# Patient Record
Sex: Male | Born: 1975 | Race: White | Hispanic: No | Marital: Married | Smoking: Former smoker
Health system: Southern US, Community
[De-identification: ages and names within clinical notes are randomized; demographics above are authoritative.]

## PROBLEM LIST (undated history)

## (undated) DIAGNOSIS — Z87442 Personal history of urinary calculi: Secondary | ICD-10-CM

## (undated) DIAGNOSIS — M1990 Unspecified osteoarthritis, unspecified site: Secondary | ICD-10-CM

## (undated) DIAGNOSIS — K219 Gastro-esophageal reflux disease without esophagitis: Secondary | ICD-10-CM

## (undated) DIAGNOSIS — I10 Essential (primary) hypertension: Secondary | ICD-10-CM

## (undated) DIAGNOSIS — N400 Enlarged prostate without lower urinary tract symptoms: Secondary | ICD-10-CM

## (undated) DIAGNOSIS — F419 Anxiety disorder, unspecified: Secondary | ICD-10-CM

## (undated) HISTORY — PX: CHOLECYSTECTOMY: SHX55

---

## 2003-12-18 ENCOUNTER — Emergency Department (HOSPITAL_COMMUNITY): Admission: EM | Admit: 2003-12-18 | Discharge: 2003-12-19 | Payer: Self-pay | Admitting: Emergency Medicine

## 2010-08-11 ENCOUNTER — Emergency Department (HOSPITAL_COMMUNITY)
Admission: EM | Admit: 2010-08-11 | Discharge: 2010-08-11 | Disposition: A | Discharge status: Home / Self Care | Payer: PRIVATE HEALTH INSURANCE | Attending: Emergency Medicine | Admitting: Emergency Medicine

## 2010-08-11 ENCOUNTER — Emergency Department (HOSPITAL_COMMUNITY): Payer: PRIVATE HEALTH INSURANCE

## 2010-08-11 DIAGNOSIS — R071 Chest pain on breathing: Secondary | ICD-10-CM | POA: Insufficient documentation

## 2010-08-11 DIAGNOSIS — Y92009 Unspecified place in unspecified non-institutional (private) residence as the place of occurrence of the external cause: Secondary | ICD-10-CM | POA: Insufficient documentation

## 2010-08-11 DIAGNOSIS — Y998 Other external cause status: Secondary | ICD-10-CM | POA: Insufficient documentation

## 2010-08-11 DIAGNOSIS — F17200 Nicotine dependence, unspecified, uncomplicated: Secondary | ICD-10-CM | POA: Insufficient documentation

## 2010-08-11 DIAGNOSIS — W07XXXA Fall from chair, initial encounter: Secondary | ICD-10-CM | POA: Insufficient documentation

## 2015-04-20 ENCOUNTER — Encounter (HOSPITAL_COMMUNITY): Payer: Self-pay | Admitting: Emergency Medicine

## 2015-04-20 ENCOUNTER — Emergency Department (HOSPITAL_COMMUNITY)
Admission: EM | Admit: 2015-04-20 | Discharge: 2015-04-20 | Disposition: A | Discharge status: Home / Self Care | Payer: BLUE CROSS/BLUE SHIELD | Attending: Emergency Medicine | Admitting: Emergency Medicine

## 2015-04-20 ENCOUNTER — Emergency Department (HOSPITAL_COMMUNITY): Payer: BLUE CROSS/BLUE SHIELD

## 2015-04-20 DIAGNOSIS — Z79899 Other long term (current) drug therapy: Secondary | ICD-10-CM | POA: Diagnosis not present

## 2015-04-20 DIAGNOSIS — R200 Anesthesia of skin: Secondary | ICD-10-CM | POA: Insufficient documentation

## 2015-04-20 DIAGNOSIS — R079 Chest pain, unspecified: Secondary | ICD-10-CM | POA: Diagnosis not present

## 2015-04-20 DIAGNOSIS — I10 Essential (primary) hypertension: Secondary | ICD-10-CM | POA: Diagnosis not present

## 2015-04-20 DIAGNOSIS — Z8719 Personal history of other diseases of the digestive system: Secondary | ICD-10-CM | POA: Diagnosis not present

## 2015-04-20 DIAGNOSIS — R0602 Shortness of breath: Secondary | ICD-10-CM | POA: Insufficient documentation

## 2015-04-20 DIAGNOSIS — R202 Paresthesia of skin: Secondary | ICD-10-CM | POA: Diagnosis not present

## 2015-04-20 DIAGNOSIS — F419 Anxiety disorder, unspecified: Secondary | ICD-10-CM | POA: Diagnosis not present

## 2015-04-20 DIAGNOSIS — F17200 Nicotine dependence, unspecified, uncomplicated: Secondary | ICD-10-CM | POA: Diagnosis not present

## 2015-04-20 DIAGNOSIS — Z791 Long term (current) use of non-steroidal anti-inflammatories (NSAID): Secondary | ICD-10-CM | POA: Diagnosis not present

## 2015-04-20 DIAGNOSIS — R002 Palpitations: Secondary | ICD-10-CM | POA: Insufficient documentation

## 2015-04-20 HISTORY — DX: Essential (primary) hypertension: I10

## 2015-04-20 HISTORY — DX: Gastro-esophageal reflux disease without esophagitis: K21.9

## 2015-04-20 HISTORY — DX: Anxiety disorder, unspecified: F41.9

## 2015-04-20 LAB — BASIC METABOLIC PANEL
Anion gap: 10 (ref 5–15)
BUN: 11 mg/dL (ref 6–20)
CHLORIDE: 100 mmol/L — AB (ref 101–111)
CO2: 29 mmol/L (ref 22–32)
Calcium: 9.6 mg/dL (ref 8.9–10.3)
Creatinine, Ser: 0.83 mg/dL (ref 0.61–1.24)
GFR calc non Af Amer: >60 mL/min (ref >60)
Glucose, Bld: 164 mg/dL — ABNORMAL HIGH (ref 65–99)
POTASSIUM: 3.5 mmol/L (ref 3.5–5.1)
SODIUM: 139 mmol/L (ref 135–145)

## 2015-04-20 LAB — CBC
HEMATOCRIT: 45.7 % (ref 39.0–52.0)
HEMOGLOBIN: 16 g/dL (ref 13.0–17.0)
MCH: 31.3 pg (ref 26.0–34.0)
MCHC: 35 g/dL (ref 30.0–36.0)
MCV: 89.3 fL (ref 78.0–100.0)
Platelets: 213 10*3/uL (ref 150–400)
RBC: 5.12 MIL/uL (ref 4.22–5.81)
RDW: 13 % (ref 11.5–15.5)
WBC: 10.2 10*3/uL (ref 4.0–10.5)

## 2015-04-20 LAB — D-DIMER, QUANTITATIVE: D-Dimer, Quant: <0.27 ug/mL-FEU (ref 0.00–0.50)

## 2015-04-20 LAB — TROPONIN I: Troponin I: <0.03 ng/mL (ref <0.031)

## 2015-04-20 NOTE — ED Notes (Signed)
Patient complaining of chest pain and shortness of breath starting 15 minutes ago. States "I feel like my heart is fluttering and I have numbness going down both my arms and legs."

## 2015-04-20 NOTE — ED Provider Notes (Signed)
CSN: [id number]     Arrival date & time 04/20/15  2046 History   First MD Initiated Contact with Patient 04/20/15 2304     Chief Complaint  Patient presents with  . Chest Pain  . Shortness of Breath     (Consider location/radiation/quality/duration/timing/severity/associated sxs/prior Treatment) HPI Comments: Patient presents for evaluation of chest pain. Patient reports sudden onset of fluttering and palpitations in his chest while watching TV. He then noticed pain, shortness of breath. He reports intermittent episodes of feeling like his heart was stopping and fluttering that have now resolved. He has never had similar symptoms. He is not expressing any chest pain currently. Symptoms lasted 15 minutes or so.  Patient is a 40 y.o. male presenting with chest pain and shortness of breath.  Chest Pain Associated symptoms: shortness of breath   Shortness of Breath Associated symptoms: chest pain     Past Medical History  Diagnosis Date  . Hypertension   . Anxiety   . GERD (gastroesophageal reflux disease)    Past Surgical History  Procedure Laterality Date  . Cholecystectomy     History reviewed. No pertinent family history. Social History  Substance Use Topics  . Smoking status: Current Every Day Smoker  . Smokeless tobacco: None  . Alcohol Use: No    Review of Systems  Respiratory: Positive for shortness of breath.   Cardiovascular: Positive for chest pain.  All other systems reviewed and are negative.     Allergies  Review of patient's allergies indicates no known allergies.  Home Medications   Prior to Admission medications   Medication Sig Start Date End Date Taking? Authorizing Provider  ALPRAZolam Prudy Feeler) 0.5 MG tablet Take 0.5 mg by mouth 2 (two) times daily as needed for anxiety or sleep.  03/16/15  Yes Historical Provider, MD  cyclobenzaprine (FLEXERIL) 10 MG tablet Take 10 mg by mouth 3 (three) times daily as needed for muscle spasms.  03/17/15  Yes  Historical Provider, MD  DULoxetine (CYMBALTA) 60 MG capsule Take 60 mg by mouth at bedtime. 02/23/15  Yes Historical Provider, MD  gabapentin (NEURONTIN) 300 MG capsule Take 300 mg by mouth 3 (three) times daily as needed (FOR NEUROPATHY).  02/23/15  Yes Historical Provider, MD  loratadine (CLARITIN) 10 MG tablet Take 10 mg by mouth at bedtime. 03/04/15  Yes Historical Provider, MD  nabumetone (RELAFEN) 750 MG tablet Take 750 mg by mouth 2 (two) times daily as needed for mild pain or moderate pain.  03/03/15  Yes Historical Provider, MD  phentermine 30 MG capsule Take 30 mg by mouth every morning. 03/01/15  Yes Historical Provider, MD  tamsulosin (FLOMAX) 0.4 MG CAPS capsule Take 0.4 mg by mouth daily. 03/03/15  Yes Historical Provider, MD  traMADol (ULTRAM-ER) 300 MG 24 hr tablet Take 300 mg by mouth daily. 04/11/15  Yes Historical Provider, MD   BP 125/81 mmHg  Pulse 106  Temp(Src) 97.5 F (36.4 C) (Oral)  Resp 22  Ht  (1.854 m)  Wt 360 lb (163.295 kg)  BMI 47.51 kg/m2  SpO2 93% Physical Exam  Constitutional: He is oriented to person, place, and time. He appears well-developed and well-nourished. No distress.  HENT:  Head: Normocephalic and atraumatic.  Right Ear: Hearing normal.  Left Ear: Hearing normal.  Nose: Nose normal.  Mouth/Throat: Oropharynx is clear and moist and mucous membranes are normal.  Eyes: Conjunctivae and EOM are normal. Pupils are equal, round, and reactive to light.  Neck: Normal range of motion.  Neck supple.  Cardiovascular: Regular rhythm, S1 normal and S2 normal.  Exam reveals no gallop and no friction rub.   No murmur heard. Pulmonary/Chest: Effort normal and breath sounds normal. No respiratory distress. He exhibits no tenderness.  Abdominal: Soft. Normal appearance and bowel sounds are normal. There is no hepatosplenomegaly. There is no tenderness. There is no rebound, no guarding, no tenderness at McBurney's point and negative Murphy's sign. No  hernia.  Musculoskeletal: Normal range of motion.  Neurological: He is alert and oriented to person, place, and time. He has normal strength. No cranial nerve deficit or sensory deficit. Coordination normal. GCS eye subscore is 4. GCS verbal subscore is 5. GCS motor subscore is 6.  Skin: Skin is warm, dry and intact. No rash noted. No cyanosis.  Psychiatric: He has a normal mood and affect. His speech is normal and behavior is normal. Thought content normal.  Nursing note and vitals reviewed.   ED Course  Procedures (including critical care time) Labs Review Labs Reviewed  BASIC METABOLIC PANEL - Abnormal; Notable for the following:    Chloride 100 (*)    Glucose, Bld 164 (*)    All other components within normal limits  CBC  TROPONIN I  D-DIMER, QUANTITATIVE (NOT AT [facility])    Imaging Review Dg Chest 2 View  04/20/2015  CLINICAL DATA:  Chest pain EXAM: CHEST  2 VIEW COMPARISON:  08/11/2010 FINDINGS: Normal heart size and mediastinal contours. No acute infiltrate or edema. No effusion or pneumothorax. No acute osseous findings. IMPRESSION: No active cardiopulmonary disease. Electronically Signed   By: Marnee Spring M.D.   On: 04/20/2015 21:24   I have personally reviewed and evaluated these images and lab results as part of my medical decision-making.   EKG Interpretation   Date/Time:  Friday April 20 2015 20:53:48 EST Ventricular Rate:  119 PR Interval:  150 QRS Duration: 92 QT Interval:  304 QTC Calculation: 427 R Axis:   62 Text Interpretation:  Sinus tachycardia Otherwise normal ECG Confirmed by  Dalayza Zambrana  MD, Dareth Andrew ([id number]) on 04/20/2015 11:03:19 PM      MDM   Final diagnoses:  Heart palpitations    Patient presents primarily for symptoms of palpitations. Patient felt a sudden onset of fluttering in his chest which was intermittent and recurrent. He reports that every time he felt fluttering he felt like he could not take a deep breath and that his heart was  stopping. This was self-limited and is no longer occurring. It seems like he became very anxious with this, started having numbness and tingling in his arms and legs, possibly hyperventilation. He did not have any typical type chest pain symptoms. Although he does have cardiac risk factors, this seems to be extremely atypical for cardiac etiology, although arrhythmia as possible. He has not had any arrhythmia here in the ER. It sounds like he is describing PACs, possible atrial bigeminy, but cannot rule out other arrhythmias. Patient instructed to return immediately for recurrent symptoms to identify arrhythmia, otherwise follow up with cardiology.    Gilda Crease, MD 04/20/15 [phone]

## 2015-04-20 NOTE — Discharge Instructions (Signed)

## 2015-04-20 NOTE — ED Notes (Signed)
Pt alert & oriented x4, stable gait. Patient given discharge instructions, paperwork & prescription(s). Patient  instructed to stop at the registration desk to finish any additional paperwork. Patient verbalized understanding. Pt left department w/ no further questions. 

## 2015-10-30 ENCOUNTER — Emergency Department (HOSPITAL_COMMUNITY)
Admission: EM | Admit: 2015-10-30 | Discharge: 2015-10-30 | Disposition: A | Discharge status: Home / Self Care | Payer: BLUE CROSS/BLUE SHIELD | Attending: Emergency Medicine | Admitting: Emergency Medicine

## 2015-10-30 ENCOUNTER — Encounter (HOSPITAL_COMMUNITY): Payer: Self-pay

## 2015-10-30 ENCOUNTER — Emergency Department (HOSPITAL_COMMUNITY): Payer: BLUE CROSS/BLUE SHIELD

## 2015-10-30 DIAGNOSIS — R112 Nausea with vomiting, unspecified: Secondary | ICD-10-CM | POA: Diagnosis present

## 2015-10-30 DIAGNOSIS — F17200 Nicotine dependence, unspecified, uncomplicated: Secondary | ICD-10-CM | POA: Insufficient documentation

## 2015-10-30 DIAGNOSIS — E860 Dehydration: Secondary | ICD-10-CM | POA: Diagnosis not present

## 2015-10-30 DIAGNOSIS — Z79899 Other long term (current) drug therapy: Secondary | ICD-10-CM | POA: Diagnosis not present

## 2015-10-30 DIAGNOSIS — I10 Essential (primary) hypertension: Secondary | ICD-10-CM | POA: Insufficient documentation

## 2015-10-30 DIAGNOSIS — R197 Diarrhea, unspecified: Secondary | ICD-10-CM

## 2015-10-30 DIAGNOSIS — R1031 Right lower quadrant pain: Secondary | ICD-10-CM | POA: Diagnosis not present

## 2015-10-30 LAB — COMPREHENSIVE METABOLIC PANEL
ALK PHOS: 49 U/L (ref 38–126)
ALT: 38 U/L (ref 17–63)
ANION GAP: 10 (ref 5–15)
AST: 22 U/L (ref 15–41)
Albumin: 3.9 g/dL (ref 3.5–5.0)
BUN: 13 mg/dL (ref 6–20)
CALCIUM: 8.6 mg/dL — AB (ref 8.9–10.3)
CO2: 20 mmol/L — ABNORMAL LOW (ref 22–32)
CREATININE: 0.81 mg/dL (ref 0.61–1.24)
Chloride: 103 mmol/L (ref 101–111)
Glucose, Bld: 136 mg/dL — ABNORMAL HIGH (ref 65–99)
Potassium: 3.8 mmol/L (ref 3.5–5.1)
SODIUM: 133 mmol/L — AB (ref 135–145)
Total Bilirubin: 1.5 mg/dL — ABNORMAL HIGH (ref 0.3–1.2)
Total Protein: 7.1 g/dL (ref 6.5–8.1)

## 2015-10-30 LAB — CBC WITH DIFFERENTIAL/PLATELET
Basophils Absolute: 0 10*3/uL (ref 0.0–0.1)
Basophils Relative: 0 %
EOS ABS: 0 10*3/uL (ref 0.0–0.7)
EOS PCT: 0 %
HCT: 48.1 % (ref 39.0–52.0)
Hemoglobin: 16.7 g/dL (ref 13.0–17.0)
LYMPHS ABS: 0.3 10*3/uL — AB (ref 0.7–4.0)
LYMPHS PCT: 3 %
MCH: 30.5 pg (ref 26.0–34.0)
MCHC: 34.7 g/dL (ref 30.0–36.0)
MCV: 87.9 fL (ref 78.0–100.0)
MONOS PCT: 3 %
Monocytes Absolute: 0.3 10*3/uL (ref 0.1–1.0)
Neutro Abs: 9.9 10*3/uL — ABNORMAL HIGH (ref 1.7–7.7)
Neutrophils Relative %: 94 %
PLATELETS: 186 10*3/uL (ref 150–400)
RBC: 5.47 MIL/uL (ref 4.22–5.81)
RDW: 13.4 % (ref 11.5–15.5)
WBC: 10.6 10*3/uL — AB (ref 4.0–10.5)

## 2015-10-30 LAB — URINALYSIS, ROUTINE W REFLEX MICROSCOPIC
BILIRUBIN URINE: NEGATIVE
Glucose, UA: NEGATIVE mg/dL
Hgb urine dipstick: NEGATIVE
KETONES UR: NEGATIVE mg/dL
LEUKOCYTES UA: NEGATIVE
NITRITE: NEGATIVE
PROTEIN: NEGATIVE mg/dL
Specific Gravity, Urine: 1.01 (ref 1.005–1.030)
pH: 6 (ref 5.0–8.0)

## 2015-10-30 LAB — I-STAT CG4 LACTIC ACID, ED
LACTIC ACID, VENOUS: 1.95 mmol/L — AB (ref 0.5–1.9)
LACTIC ACID, VENOUS: 1.13 mmol/L (ref 0.5–1.9)

## 2015-10-30 LAB — LIPASE, BLOOD: LIPASE: 13 U/L (ref 11–51)

## 2015-10-30 MED ORDER — SODIUM CHLORIDE 0.9 % IV BOLUS (SEPSIS)
1000.0000 mL | Freq: Once | INTRAVENOUS | Status: AC
  Administered 2015-10-30: 1000 mL via INTRAVENOUS

## 2015-10-30 MED ORDER — IOPAMIDOL (ISOVUE-300) INJECTION 61%
100.0000 mL | Freq: Once | INTRAVENOUS | Status: AC | PRN
  Administered 2015-10-30: 100 mL via INTRAVENOUS

## 2015-10-30 MED ORDER — ONDANSETRON HCL 4 MG PO TABS: 4.0000 mg | ORAL_TABLET | Freq: Four times a day (QID) | ORAL | 0 refills | Status: DC

## 2015-10-30 MED ORDER — HYDROCODONE-ACETAMINOPHEN 5-325 MG PO TABS: 1.0000 | ORAL_TABLET | ORAL | 0 refills | Status: DC | PRN

## 2015-10-30 MED ORDER — ONDANSETRON HCL 4 MG/2ML IJ SOLN
4.0000 mg | Freq: Once | INTRAMUSCULAR | Status: AC
  Administered 2015-10-30: 4 mg via INTRAVENOUS
  Filled 2015-10-30: qty 2

## 2015-10-30 NOTE — ED Provider Notes (Signed)
Pt has improved.  He will be discharged home with instructions to return if worse.  He has been able to tolerate po fluids.   Dan LefevreJulie Malvern Kadlec, MD 10/30/15 [phone]

## 2015-10-30 NOTE — ED Provider Notes (Signed)
[facility]-EMERGENCY DEPT Provider Note   CSN: [id number] Arrival date & time: 10/30/15  1236     History   Chief Complaint Chief Complaint  Patient presents with  . Emesis  . Diarrhea    HPI Dan Castaneda is a 40 y.o. male.  Patient presents with abdominal pain, diarrhea, nausea and vomiting. States started last night as diarrhea around 8 PM. Has had progressive abdominal cramping, multiple episodes of nonbloody diarrhea as well as about 10 episodes of nonbloody emesis since this morning. Denies any other sick contacts at home. Has had chills but no fever. He had chicken salad last night that he made himself. Other people ate the same salad and did not get sick. No recent travel or antibiotic use. Denies any testicular pain, dysuria or hematuria. No back pain. Previous cholecystectomy.   The history is provided by the patient.  Emesis   Associated symptoms include abdominal pain, chills and diarrhea. Pertinent negatives include no arthralgias, no cough, no fever, no headaches and no myalgias.  Diarrhea   Associated symptoms include abdominal pain, vomiting and chills. Pertinent negatives include no headaches, no arthralgias, no myalgias and no cough.    Past Medical History:  Diagnosis Date  . Anxiety   . GERD (gastroesophageal reflux disease)   . Hypertension     There are no active problems to display for this patient.   Past Surgical History:  Procedure Laterality Date  . CHOLECYSTECTOMY         Home Medications    Prior to Admission medications   Medication Sig Start Date End Date Taking? Authorizing Provider  ALPRAZolam [name] 0.5 MG tablet Take 0.5 mg by mouth 2 (two) times daily as needed for anxiety or sleep.  03/16/15   Historical Provider, MD  cyclobenzaprine (FLEXERIL) 10 MG tablet Take 10 mg by mouth 3 (three) times daily as needed for muscle spasms.  03/17/15   Historical Provider, MD  DULoxetine (CYMBALTA) 60 MG capsule Take 60 mg by mouth at bedtime.  02/23/15   Historical Provider, MD  gabapentin (NEURONTIN) 300 MG capsule Take 300 mg by mouth 3 (three) times daily as needed (FOR NEUROPATHY).  02/23/15   Historical Provider, MD  loratadine (CLARITIN) 10 MG tablet Take 10 mg by mouth at bedtime. 03/04/15   Historical Provider, MD  nabumetone (RELAFEN) 750 MG tablet Take 750 mg by mouth 2 (two) times daily as needed for mild pain or moderate pain.  03/03/15   Historical Provider, MD  phentermine 30 MG capsule Take 30 mg by mouth every morning. 03/01/15   Historical Provider, MD  tamsulosin (FLOMAX) 0.4 MG CAPS capsule Take 0.4 mg by mouth daily. 03/03/15   Historical Provider, MD  traMADol (ULTRAM-ER) 300 MG 24 hr tablet Take 300 mg by mouth daily. 04/11/15   Historical Provider, MD    Family History No family history on file.  Social History Social History  Substance Use Topics  . Smoking status: Current Every Day Smoker  . Smokeless tobacco: Never Used  . Alcohol use No     Allergies   Review of patient's allergies indicates no known allergies.   Review of Systems Review of Systems  Constitutional: Positive for activity change, appetite change, chills and fatigue. Negative for fever.  HENT: Negative for congestion.   Eyes: Negative for visual disturbance.  Respiratory: Negative for cough, chest tightness and shortness of breath.   Gastrointestinal: Positive for abdominal pain, diarrhea, nausea and vomiting. Negative for blood in stool.  Genitourinary: Negative  for dysuria, hematuria, testicular pain and urgency.  Musculoskeletal: Negative for arthralgias and myalgias.  Skin: Negative for rash.  Neurological: Negative for dizziness, weakness, light-headedness and headaches.  A complete 10 system review of systems was obtained and all systems are negative except as noted in the HPI and PMH.    Physical Exam Updated Vital Signs BP 127/93 (BP Location: Right Arm)   Pulse (!) 145   Temp 99 F (37.2 C) (Oral)   Resp 20   Ht  6\' 1"  (1.854 m)   Wt (!) 315 lb (142.9 kg)   SpO2 98%   BMI 41.56 kg/m   Physical Exam  Constitutional: He is oriented to person, place, and time. He appears well-developed and well-nourished. No distress.  Ill appearing, clammy  HENT:  Head: Normocephalic and atraumatic.  Mouth/Throat: No oropharyngeal exudate.  Dry mucus membranes  Eyes: Conjunctivae and EOM are normal. Pupils are equal, round, and reactive to light.  Neck: Normal range of motion. Neck supple.  No meningismus.  Cardiovascular: Normal rate, normal heart sounds and intact distal pulses.   No murmur heard. Tachycardic to 120s  Pulmonary/Chest: Effort normal and breath sounds normal. No respiratory distress.  Abdominal: Soft. There is tenderness. There is no rebound and no guarding.  Obese, TTP RLQ. No guarding or rebound  Musculoskeletal: Normal range of motion. He exhibits no edema or tenderness.  Neurological: He is alert and oriented to person, place, and time. No cranial nerve deficit. He exhibits normal muscle tone. Coordination normal.  No ataxia on finger to nose bilaterally. No pronator drift. 5/5 strength throughout. CN 2-12 intact.Equal grip strength. Sensation intact.   Skin: Skin is warm.  Psychiatric: He has a normal mood and affect. His behavior is normal.  Nursing note and vitals reviewed.    ED Treatments / Results  Labs (all labs ordered are listed, but only abnormal results are displayed) Labs Reviewed  CBC WITH DIFFERENTIAL/PLATELET - Abnormal; Notable for the following:       Result Value   WBC 10.6 (*)    Neutro Abs 9.9 (*)    Lymphs Abs 0.3 (*)    All other components within normal limits  COMPREHENSIVE METABOLIC PANEL - Abnormal; Notable for the following:    Sodium 133 (*)    CO2 20 (*)    Glucose, Bld 136 (*)    Calcium 8.6 (*)    Total Bilirubin 1.5 (*)    All other components within normal limits  I-STAT CG4 LACTIC ACID, ED - Abnormal; Notable for the following:    Lactic  Acid, Venous 1.95 (*)    All other components within normal limits  LIPASE, BLOOD  URINALYSIS, ROUTINE W REFLEX MICROSCOPIC (NOT AT [facility])  I-STAT CG4 LACTIC ACID, ED  I-STAT CG4 LACTIC ACID, ED    EKG  EKG Interpretation  Date/Time:  Tuesday October 30 2015 13:43:28 EDT Ventricular Rate:  124 PR Interval:    QRS Duration: 90 QT Interval:  289 QTC Calculation: 415 R Axis:   69 Text Interpretation:  Sinus tachycardia RSR' in V1 or V2, right VCD or RVH No significant change was found Confirmed by Manus Gunning  MD, Latesha Chesney [phone]) on 10/30/2015 1:52:41 PM       Radiology Ct Abdomen Pelvis W Contrast  Result Date: 10/30/2015 CLINICAL DATA:  Right lower quadrant pain EXAM: CT ABDOMEN AND PELVIS WITH CONTRAST TECHNIQUE: Multidetector CT imaging of the abdomen and pelvis was performed using the standard protocol following bolus administration of intravenous contrast.  CONTRAST:  100mL ISOVUE-300 IOPAMIDOL (ISOVUE-300) INJECTION 61% COMPARISON:  None. FINDINGS: Lower chest:  Lung bases clear without infiltrate or effusion. Hepatobiliary: Mild fatty infiltration of liver without focal liver lesion. Cholecystectomy. Bile ducts normal in caliber Pancreas: Negative Spleen: Negative Adrenals/Urinary Tract: Symmetric excretion of contrast by the kidneys. No renal mass or obstruction or stone. Ureters nondilated. Urinary bladder normal. Stomach/Bowel: Small hiatal hernia. Normal stomach and duodenum. Negative for bowel obstruction. No bowel edema or mass. Normal appendix. Vascular/Lymphatic: Minimal atherosclerotic calcification in the aorta without aneurysm. IVC normal. No lymphadenopathy. Reproductive: Mild prostate enlargement Other: No free fluid.  No hernia. Musculoskeletal: Lumbar disc degeneration. No acute skeletal abnormality. IMPRESSION: No acute abnormality.  No cause for acute abdominal pain. Normal appendix Small hiatal hernia Electronically Signed   By: Marlan Palauharles  Clark M.D.   On: 10/30/2015 15:57     Procedures Procedures (including critical care time)  Medications Ordered in ED Medications  sodium chloride 0.9 % bolus 1,000 mL (1,000 mLs Intravenous New Bag/Given 10/30/15 1339)  ondansetron (ZOFRAN) injection 4 mg (4 mg Intravenous Given 10/30/15 1339)     Initial Impression / Assessment and Plan / ED Course  I have reviewed the triage vital signs and the nursing notes.  Pertinent labs & imaging results that were available during my care of the patient were reviewed by me and considered in my medical decision making (see chart for details).  Clinical Course  Patient presents with diarrhea, abdominal cramping and vomiting since last night. No sick contacts or recent travel. No fever but has had chills. He is tachycardic and ill-appearing with right lower quadrant tenderness on exam.  Lactate and white blood cell count minimally elevated. Patient given IV fluids. Given his localizing right lower quadrant tenderness with GI symptoms we'll obtain CT.  Heart rate remains elevated but improving to 1 teens.  CT scan shows normal appendix. Patient feeling improved but is still tachycardic. Will receive additional liter of IV fluid and repeat lactate. We'll attempt by mouth challenge.  Dr. Particia NearingHaviland to assume care at shift change. Final Clinical Impressions(s) / ED Diagnoses   Final diagnoses:  Nausea vomiting and diarrhea  Right lower quadrant abdominal pain    New Prescriptions New Prescriptions   No medications on file     Glynn OctaveStephen Erandy Mceachern, MD 10/30/15 1606

## 2015-10-30 NOTE — ED Triage Notes (Signed)
Pt reports n/v/d since last night and generalized body aches.

## 2017-03-14 ENCOUNTER — Emergency Department (HOSPITAL_COMMUNITY): Payer: PRIVATE HEALTH INSURANCE

## 2017-03-14 ENCOUNTER — Other Ambulatory Visit: Payer: Self-pay

## 2017-03-14 ENCOUNTER — Encounter (HOSPITAL_COMMUNITY): Payer: Self-pay | Admitting: Emergency Medicine

## 2017-03-14 DIAGNOSIS — Z5321 Procedure and treatment not carried out due to patient leaving prior to being seen by health care provider: Secondary | ICD-10-CM | POA: Insufficient documentation

## 2017-03-14 DIAGNOSIS — R079 Chest pain, unspecified: Secondary | ICD-10-CM | POA: Insufficient documentation

## 2017-03-14 LAB — BASIC METABOLIC PANEL
ANION GAP: 8 (ref 5–15)
BUN: 10 mg/dL (ref 6–20)
CALCIUM: 9.3 mg/dL (ref 8.9–10.3)
CO2: 27 mmol/L (ref 22–32)
CREATININE: 0.91 mg/dL (ref 0.61–1.24)
Chloride: 101 mmol/L (ref 101–111)
GFR calc Af Amer: >60 mL/min (ref >60)
GLUCOSE: 135 mg/dL — AB (ref 65–99)
Potassium: 3.7 mmol/L (ref 3.5–5.1)
Sodium: 136 mmol/L (ref 135–145)

## 2017-03-14 LAB — I-STAT TROPONIN, ED: TROPONIN I, POC: 0 ng/mL (ref 0.00–0.08)

## 2017-03-14 LAB — CBC
HCT: 44.4 % (ref 39.0–52.0)
HEMOGLOBIN: 15.1 g/dL (ref 13.0–17.0)
MCH: 30.4 pg (ref 26.0–34.0)
MCHC: 34 g/dL (ref 30.0–36.0)
MCV: 89.3 fL (ref 78.0–100.0)
PLATELETS: 221 10*3/uL (ref 150–400)
RBC: 4.97 MIL/uL (ref 4.22–5.81)
RDW: 13.4 % (ref 11.5–15.5)
WBC: 8.2 10*3/uL (ref 4.0–10.5)

## 2017-03-14 NOTE — ED Triage Notes (Addendum)
Reports eating dinner tonight when he had sudden onset mid to left sided chest pressure.  Endorses SOB.  Had the same once before but did not find a reason at [facility].  During triage reports feeling like heart was racing.  At that point rated pain at 8/10.  Then reports feeling like he converted.  Now rating pain at 4.

## 2017-03-14 NOTE — ED Notes (Signed)
Significant other at the desk requesting patient go back to a room now.  States I have seen you let all these crackheads back before us.  Explained that we are working to get him back as soon as possible.  Tech rechecking vitals at this time.

## 2017-03-15 ENCOUNTER — Emergency Department (HOSPITAL_COMMUNITY)
Admission: EM | Admit: 2017-03-15 | Discharge: 2017-03-15 | Disposition: A | Discharge status: Home / Self Care | Payer: PRIVATE HEALTH INSURANCE | Attending: Emergency Medicine | Admitting: Emergency Medicine

## 2017-03-15 NOTE — ED Notes (Signed)
Still no answer when called x 3.  Removed from system at this time.

## 2017-03-15 NOTE — ED Notes (Signed)
Pt called to go back to a room no answer

## 2018-07-22 ENCOUNTER — Emergency Department (HOSPITAL_COMMUNITY)
Admission: EM | Admit: 2018-07-22 | Discharge: 2018-07-22 | Discharge status: Against Medical Advice | Payer: Managed Care, Other (non HMO) | Attending: Emergency Medicine | Admitting: Emergency Medicine

## 2018-07-22 ENCOUNTER — Encounter (HOSPITAL_COMMUNITY): Payer: Self-pay | Admitting: Emergency Medicine

## 2018-07-22 ENCOUNTER — Other Ambulatory Visit: Payer: Self-pay

## 2018-07-22 ENCOUNTER — Emergency Department (HOSPITAL_COMMUNITY): Payer: Managed Care, Other (non HMO)

## 2018-07-22 DIAGNOSIS — B349 Viral infection, unspecified: Secondary | ICD-10-CM | POA: Insufficient documentation

## 2018-07-22 DIAGNOSIS — J029 Acute pharyngitis, unspecified: Secondary | ICD-10-CM | POA: Diagnosis present

## 2018-07-22 DIAGNOSIS — Z20828 Contact with and (suspected) exposure to other viral communicable diseases: Secondary | ICD-10-CM | POA: Insufficient documentation

## 2018-07-22 DIAGNOSIS — Z5329 Procedure and treatment not carried out because of patient's decision for other reasons: Secondary | ICD-10-CM | POA: Diagnosis not present

## 2018-07-22 DIAGNOSIS — Z87891 Personal history of nicotine dependence: Secondary | ICD-10-CM | POA: Insufficient documentation

## 2018-07-22 DIAGNOSIS — Z79899 Other long term (current) drug therapy: Secondary | ICD-10-CM | POA: Insufficient documentation

## 2018-07-22 DIAGNOSIS — I10 Essential (primary) hypertension: Secondary | ICD-10-CM | POA: Insufficient documentation

## 2018-07-22 LAB — COMPREHENSIVE METABOLIC PANEL
ALT: 67 U/L — ABNORMAL HIGH (ref 0–44)
AST: 33 U/L (ref 15–41)
Albumin: 4.2 g/dL (ref 3.5–5.0)
Alkaline Phosphatase: 53 U/L (ref 38–126)
Anion gap: 10 (ref 5–15)
BUN: 13 mg/dL (ref 6–20)
CO2: 27 mmol/L (ref 22–32)
Calcium: 9.7 mg/dL (ref 8.9–10.3)
Chloride: 101 mmol/L (ref 98–111)
Creatinine, Ser: 0.78 mg/dL (ref 0.61–1.24)
GFR calc Af Amer: >60 mL/min (ref >60)
GFR calc non Af Amer: >60 mL/min (ref >60)
Glucose, Bld: 106 mg/dL — ABNORMAL HIGH (ref 70–99)
Potassium: 4.3 mmol/L (ref 3.5–5.1)
Sodium: 138 mmol/L (ref 135–145)
Total Bilirubin: 0.9 mg/dL (ref 0.3–1.2)
Total Protein: 7.4 g/dL (ref 6.5–8.1)

## 2018-07-22 LAB — CBC WITH DIFFERENTIAL/PLATELET
Abs Immature Granulocytes: 0.05 10*3/uL (ref 0.00–0.07)
Basophils Absolute: 0 10*3/uL (ref 0.0–0.1)
Basophils Relative: 1 %
Eosinophils Absolute: 0.1 10*3/uL (ref 0.0–0.5)
Eosinophils Relative: 1 %
HCT: 45 % (ref 39.0–52.0)
Hemoglobin: 15.5 g/dL (ref 13.0–17.0)
Immature Granulocytes: 1 %
Lymphocytes Relative: 28 %
Lymphs Abs: 1.9 10*3/uL (ref 0.7–4.0)
MCH: 30.6 pg (ref 26.0–34.0)
MCHC: 34.4 g/dL (ref 30.0–36.0)
MCV: 88.9 fL (ref 80.0–100.0)
Monocytes Absolute: 0.5 10*3/uL (ref 0.1–1.0)
Monocytes Relative: 7 %
Neutro Abs: 4.3 10*3/uL (ref 1.7–7.7)
Neutrophils Relative %: 62 %
Platelets: 239 10*3/uL (ref 150–400)
RBC: 5.06 MIL/uL (ref 4.22–5.81)
RDW: 13.2 % (ref 11.5–15.5)
WBC: 6.8 10*3/uL (ref 4.0–10.5)
nRBC: 0 % (ref 0.0–0.2)

## 2018-07-22 LAB — GROUP A STREP BY PCR: Group A Strep by PCR: NOT DETECTED

## 2018-07-22 MED ORDER — LORAZEPAM 1 MG PO TABS
1.0000 mg | ORAL_TABLET | Freq: Once | ORAL | Status: AC
  Administered 2018-07-22: 1 mg via ORAL
  Filled 2018-07-22: qty 1

## 2018-07-22 MED ORDER — METOCLOPRAMIDE HCL 5 MG/ML IJ SOLN
10.0000 mg | Freq: Once | INTRAMUSCULAR | Status: AC
  Administered 2018-07-22: 10 mg via INTRAVENOUS
  Filled 2018-07-22: qty 2

## 2018-07-22 MED ORDER — KETOROLAC TROMETHAMINE 30 MG/ML IJ SOLN
15.0000 mg | Freq: Once | INTRAMUSCULAR | Status: AC
  Administered 2018-07-22: 15 mg via INTRAVENOUS
  Filled 2018-07-22: qty 1

## 2018-07-22 MED ORDER — SODIUM CHLORIDE 0.9 % IV BOLUS
500.0000 mL | Freq: Once | INTRAVENOUS | Status: AC
  Administered 2018-07-22: 500 mL via INTRAVENOUS

## 2018-07-22 NOTE — ED Provider Notes (Signed)
[facility] EMERGENCY DEPARTMENT Provider Note   CSN: [id number] Arrival date & time: 07/22/18  1233    History   Chief Complaint Chief Complaint  Patient presents with  . Fatigue  . Diarrhea  . Sore Throat    HPI Dan Castaneda is a 43 y.o. male with history of hypertension, GERD, anxiety who presents with a 4-day history of headache, generalized fatigue, diarrhea, and 1 day history of sore throat.  Patient reports his ears have been popping and he has had some pain and fullness.  He denies any significant cough, shortness of breath, chest pain, abdominal pain, nausea, vomiting, urinary symptoms.  He has been taking ibuprofen at home.  He states his headache is the worst symptom.  He reports it started fairly suddenly on Monday, however he is unsure who it was over several seconds or several minutes to hours.  He has had some associated neck pain, but states he has no difficulty moving his neck.  His husband has had some nonspecific, vague symptoms as well.     HPI  Past Medical History:  Diagnosis Date  . Anxiety   . GERD (gastroesophageal reflux disease)   . Hypertension     There are no active problems to display for this patient.   Past Surgical History:  Procedure Laterality Date  . CHOLECYSTECTOMY          Home Medications    Prior to Admission medications   Medication Sig Start Date End Date Taking? Authorizing Provider  ALPRAZolam Prudy Feeler) 0.5 MG tablet Take 0.5 mg by mouth 2 (two) times daily as needed for anxiety or sleep.  03/16/15   [provider]  cyclobenzaprine (FLEXERIL) 10 MG tablet Take 10 mg by mouth 3 (three) times daily as needed for muscle spasms.  03/17/15   [provider]  DULoxetine (CYMBALTA) 60 MG capsule Take 60 mg by mouth at bedtime. 02/23/15   [provider]  gabapentin (NEURONTIN) 300 MG capsule Take 300 mg by mouth 3 (three) times daily as needed (FOR NEUROPATHY).  02/23/15   [provider]   HYDROcodone-acetaminophen (NORCO/VICODIN) 5-325 MG tablet Take 1 tablet by mouth every 4 (four) hours as needed. 10/30/15   Jacalyn Lefevre, MD  loratadine (CLARITIN) 10 MG tablet Take 10 mg by mouth at bedtime. 03/04/15   [provider]  nabumetone (RELAFEN) 750 MG tablet Take 750 mg by mouth 2 (two) times daily as needed for mild pain or moderate pain.  03/03/15   [provider]  ondansetron (ZOFRAN) 4 MG tablet Take 1 tablet (4 mg total) by mouth every 6 (six) hours. 10/30/15   Jacalyn Lefevre, MD  tamsulosin (FLOMAX) 0.4 MG CAPS capsule Take 0.4 mg by mouth daily. 03/03/15   [provider]  traMADol (ULTRAM-ER) 300 MG 24 hr tablet Take 300 mg by mouth daily. 04/11/15   [provider]    Family History No family history on file.  Social History Social History   Tobacco Use  . Smoking status: Former Games developer  . Smokeless tobacco: Never Used  Substance Use Topics  . Alcohol use: No  . Drug use: No     Allergies   Patient has no known allergies.   Review of Systems Review of Systems  Constitutional: Positive for fatigue. Negative for chills and fever.  HENT: Positive for ear pain and sore throat. Negative for facial swelling.   Respiratory: Negative for cough and shortness of breath.   Cardiovascular: Negative for chest pain.  Gastrointestinal: Positive for diarrhea. Negative for abdominal pain, blood in stool, nausea and vomiting.  Genitourinary: Negative for dysuria.  Musculoskeletal: Positive for neck pain. Negative for back pain and neck stiffness.  Skin: Negative for rash and wound.  Neurological: Positive for headaches.  Psychiatric/Behavioral: The patient is not nervous/anxious.      Physical Exam Updated Vital Signs BP 118/77 (BP Location: Right Arm)   Pulse 76   Temp 98.6 F (37 C) (Oral)   Resp 16   Ht 6\' 1"  (1.854 m)   Wt (!) 145.2 kg   SpO2 99%   BMI 42.22 kg/m   Physical Exam Vitals signs and nursing note  reviewed.  Constitutional:      General: He is not in acute distress.    Appearance: He is well-developed. He is not diaphoretic.  HENT:     Head: Normocephalic and atraumatic.     Mouth/Throat:     Pharynx: Posterior oropharyngeal erythema present. No oropharyngeal exudate.     Tonsils: No tonsillar exudate or tonsillar abscesses.  Eyes:     General: No scleral icterus.       Right eye: No discharge.        Left eye: No discharge.     Conjunctiva/sclera: Conjunctivae normal.     Pupils: Pupils are equal, round, and reactive to light.  Neck:     Musculoskeletal: Normal range of motion and neck supple. No neck rigidity, spinous process tenderness or muscular tenderness.     Thyroid: No thyromegaly.  Cardiovascular:     Rate and Rhythm: Normal rate and regular rhythm.     Heart sounds: Normal heart sounds. No murmur. No friction rub. No gallop.   Pulmonary:     Effort: Pulmonary effort is normal. No respiratory distress.     Breath sounds: Normal breath sounds. No stridor. No wheezing or rales.  Abdominal:     General: Bowel sounds are normal. There is no distension.     Palpations: Abdomen is soft.     Tenderness: There is no abdominal tenderness. There is no guarding or rebound.  Lymphadenopathy:     Cervical: No cervical adenopathy (anterior tenderness bilaterally, no notable edema or palpable lymph node).  Skin:    General: Skin is warm and dry.     Coloration: Skin is not pale.     Findings: No rash.  Neurological:     Mental Status: He is alert.     Coordination: Coordination normal.      ED Treatments / Results  Labs (all labs ordered are listed, but only abnormal results are displayed) Labs Reviewed  COMPREHENSIVE METABOLIC PANEL - Abnormal; Notable for the following components:      Result Value   Glucose, Bld 106 (*)    ALT 67 (*)    All other components within normal limits  GROUP A STREP BY PCR  CBC WITH DIFFERENTIAL/PLATELET    EKG None  Radiology  No results found.  Procedures Procedures (including critical care time)  Medications Ordered in ED Medications  sodium chloride 0.9 % bolus 500 mL (0 mLs Intravenous Stopped 07/22/18 1420)  metoCLOPramide (REGLAN) injection 10 mg (10 mg Intravenous Given 07/22/18 1349)  ketorolac (TORADOL) 30 MG/ML injection 15 mg (15 mg Intravenous Given 07/22/18 1348)  LORazepam (ATIVAN) tablet 1 mg (1 mg Oral Given 07/22/18 1547)     Initial Impression / Assessment and Plan / ED Course  I have reviewed the triage vital signs and the nursing notes.  Pertinent  labs & imaging results that were available during my care of the patient were reviewed by me and considered in my medical decision making (see chart for details).        Patient presenting with headache, diarrhea, ear pressure, generalized fatigue.  Suspect viral syndrome.  Patient denies any documented fever, shortness of breath, cough.  He has no abdominal pain, except for cramping prior to diarrhea.  There is no focal abdominal tenderness on exam.  Labs are unremarkable except for mild elevation in ALT, 67.  Rapid strep is negative.  COVID-19 a possibility, however unlikely considering no cardinal symptoms.  Normal neuro exam without focal deficits, although considering patient's reported sudden headache that began over 3 days ago, will obtain screening CT head.  Low suspicion of meningitis especially due to patient's significant mobility of the neck, no documented fever, and patient's resolution of headache after headache cocktail in the ED. At shift change, patient care transferred to CT Head for continued evaluation, follow up of Ivery QualeHobson Bryant, PA-C and determination of disposition. Anticipate discharge with supportive treatment and home isolation if CT negative.  Patient also evaluated by my attending, Dr. Jodi MourningZavitz, who guided the patient's management and agrees with plan.  Dan Castaneda was evaluated in Emergency Department on 07/22/2018 for the symptoms  described in the history of present illness. He was evaluated in the context of the global COVID-19 pandemic, which necessitated consideration that the patient might be at risk for infection with the SARS-CoV-2 virus that causes COVID-19. Institutional protocols and algorithms that pertain to the evaluation of patients at risk for COVID-19 are in a state of rapid change based on information released by regulatory bodies including the CDC and federal and state organizations. These policies and algorithms were followed during the patient's care in the ED.  Final Clinical Impressions(s) / ED Diagnoses   Final diagnoses:  Viral syndrome  Sore throat    ED Discharge Orders    None       Emi HolesLaw, Alletta Mattos M, PA-C 07/22/18 1636    Blane OharaZavitz, Joshua, MD 07/23/18 [phone]

## 2018-07-22 NOTE — ED Notes (Signed)
Pt didn't want to wait for CT. Singed out AMA

## 2018-07-22 NOTE — ED Triage Notes (Signed)
Pt states that he has had diaherra sore throat general fatigue and a headache since Monday

## 2018-08-23 ENCOUNTER — Ambulatory Visit (INDEPENDENT_AMBULATORY_CARE_PROVIDER_SITE_OTHER): Payer: Managed Care, Other (non HMO)

## 2018-08-23 ENCOUNTER — Other Ambulatory Visit: Payer: Self-pay

## 2018-08-23 ENCOUNTER — Encounter: Payer: Self-pay | Admitting: Orthopedic Surgery

## 2018-08-23 ENCOUNTER — Ambulatory Visit: Payer: Managed Care, Other (non HMO) | Admitting: Orthopedic Surgery

## 2018-08-23 VITALS — BP 136/97 | HR 85 | Temp 97.2°F | Ht 73.0 in | Wt 340.0 lb

## 2018-08-23 DIAGNOSIS — M25561 Pain in right knee: Secondary | ICD-10-CM | POA: Diagnosis not present

## 2018-08-23 DIAGNOSIS — M25562 Pain in left knee: Secondary | ICD-10-CM

## 2018-08-23 DIAGNOSIS — M545 Low back pain, unspecified: Secondary | ICD-10-CM

## 2018-08-23 DIAGNOSIS — Z6841 Body Mass Index (BMI) 40.0 and over, adult: Secondary | ICD-10-CM | POA: Diagnosis not present

## 2018-08-23 DIAGNOSIS — G8929 Other chronic pain: Secondary | ICD-10-CM | POA: Diagnosis not present

## 2018-08-23 MED ORDER — DICLOFENAC SODIUM 50 MG PO TBEC: 50.0000 mg | DELAYED_RELEASE_TABLET | Freq: Two times a day (BID) | ORAL | 5 refills | Status: DC

## 2018-08-23 NOTE — Progress Notes (Addendum)
NEW PROBLEM OFFICE VISIT  Chief Complaint  Patient presents with  . 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  He reports that he feels weakness in both lower extremities and that his knees may topple over at any second although he denies any trauma.  This is associated with a burning pain down the lateral side of both legs.  He has taken ibuprofen but no other treatments  He does use a cane and is used that for 6 months more so in the last 3 months   Review of Systems  Eyes: Positive for photophobia.  Cardiovascular: Positive for claudication.  Musculoskeletal: Positive for back pain, joint pain and neck pain.  Neurological: Positive for headaches.  All other systems reviewed and are negative.    Past Medical History:  Diagnosis Date  . Anxiety   . GERD (gastroesophageal reflux disease)   . Hypertension     Past Surgical History:  Procedure Laterality Date  . CHOLECYSTECTOMY      Family History  Problem Relation Age of Onset  . Diabetes Mother   . Heart disease Mother   . Hypertension Mother   . Cancer Father   . Diabetes Father   . Heart disease Father   . Hypertension Father    Social History   Tobacco Use  . Smoking status: Former Research scientist (life sciences)  . Smokeless tobacco: Never Used  Substance Use Topics  . Alcohol use: No  . Drug use: No    No Known Allergies  Current Meds  Medication Sig  . hydrochlorothiazide (HYDRODIURIL) 25 MG tablet Take 25 mg by mouth daily.    BP (!) 136/97   Pulse 85   Temp (!) 97.2 F (36.2 C)   Ht 6\' 1"  (1.854 m)   Wt (!) 340 lb (154.2 kg)   BMI 44.86 kg/m   Physical Exam Vitals signs and nursing note reviewed.  Constitutional:      Appearance: Normal appearance.  Neurological:     Mental Status: He is alert  and oriented to person, place, and time.  Psychiatric:        Mood and Affect: Mood normal.     Ortho Exam  Patient is ambulatory with a cane  His gait is remarkable for short stride length decreased cadence and decreased extension bilaterally  Right knee no effusion is seen.  We find medial lateral joint line tenderness.  His flexion arc is 130 degrees.  He is stable in coronal and sagittal plane with normal quadriceps power.  I do not see any scars or skin lesions.  He appears to have normal pain response to palpation and position sense is intact  Left knee is noted to have no effusion, he does have medial and lateral joint line tenderness with a normal flexion arc.  The cruciate ligaments and collateral ligaments are stable.  No skin lesions or surgical scars are seen.  Normal pain response to palpation and position  Distally there is no peripheral edema extremities are warm to touch  MEDICAL DECISION SECTION  Xrays were done at [facility] please see the dictated report  My independent reading of xrays:  [name]   Encounter Diagnoses  Name Primary?  . Chronic pain of left knee Yes  .  Chronic pain of right knee   . Chronic low back pain, unspecified back pain laterality, unspecified whether sciatica present   . Body mass index 40.0-44.9, adult (HCC)   . Morbid obesity (HCC)    The patient meets the AMA guidelines for Morbid (severe) obesity with a BMI > 40.0 and I have recommended weight loss.  PLAN: (Rx., injectx, surgery, frx, mri/ct)  At 43 years old the patient will have to be treated nonsurgically.  He needs to have full nonoperative treatment modalities instituted prior to any surgical intervention.  He seems to have pain severity and location along the legs consistent with lumbar referred pain and neurogenic pain and may benefit from pain management  Meds ordered this encounter  Medications  . diclofenac (VOLTAREN) 50 MG EC  tablet    Sig: Take 1 tablet (50 mg total) by mouth 2 (two) times daily.    Dispense:  60 tablet    Refill:  5   Procedure note for bilateral knee injections  Procedure note left knee injection verbal consent was obtained to inject left knee joint  Timeout was completed to confirm the site of injection  The medications used were 40 mg of Depo-Medrol and 1% lidocaine 3 cc  Anesthesia was provided by ethyl chloride and the skin was prepped with alcohol.  After cleaning the skin with alcohol a 20-gauge needle was used to inject the left knee joint. There were no complications. A sterile bandage was applied.   Procedure note right knee injection verbal consent was obtained to inject right knee joint  Timeout was completed to confirm the site of injection  The medications used were 40 mg of Depo-Medrol and 1% lidocaine 3 cc  Anesthesia was provided by ethyl chloride and the skin was prepped with alcohol.  After cleaning the skin with alcohol a 20-gauge needle was used to inject the right knee joint. There were no complications. A sterile bandage was applied.   1 YR   Dan CanadaStanley Aubrionna Istre, MD  08/23/2018 3:49 PM

## 2018-08-23 NOTE — Patient Instructions (Addendum)
Weight loss  Exercise Nsaids we will try diclofenac You received 2 injections to help with pain     Arthritis Arthritis is a term that is commonly used to refer to joint pain or joint disease. There are more than 100 types of arthritis. What are the causes? The most common cause of this condition is wear and tear of a joint. Other causes include:  Gout.  Inflammation of a joint.  An infection of a joint.  Sprains and other injuries near the joint.  A drug reaction or allergic reaction. In some cases, the cause may not be known. What are the signs or symptoms? The main symptom of this condition is pain in the joint with movement. Other symptoms include:  Redness, swelling, or stiffness at a joint.  Warmth coming from the joint.  Fever.  Overall feeling of illness. How is this diagnosed? This condition may be diagnosed with a physical exam and tests, including:  Blood tests.  Urine tests.  Imaging tests, such as MRI, X-rays, or a CT scan. Sometimes, fluid is removed from a joint for testing. How is this treated? Treatment for this condition may involve:  Treatment of the cause, if it is known.  Rest.  Raising (elevating) the joint.  Applying cold or hot packs to the joint.  Medicines to improve symptoms and reduce inflammation.  Injections of a steroid such as cortisone into the joint to help reduce pain and inflammation. Depending on the cause of your arthritis, you may need to make lifestyle changes to reduce stress on your joint. These changes may include exercising more and losing weight. Follow these instructions at home: Medicines  Take over-the-counter and prescription medicines only as told by your health care provider.  Do not take aspirin to relieve pain if gout is suspected. Activity  Rest your joint if told by your health care provider. Rest is important when your disease is active and your joint feels painful, swollen, or stiff.  Avoid  activities that make the pain worse. It is important to balance activity with rest.  Exercise your joint regularly with range-of-motion exercises as told by your health care provider. Try doing low-impact exercise, such as: ? Swimming. ? Water aerobics. ? Biking. ? Walking. Joint Care   If your joint is swollen, keep it elevated if told by your health care provider.  If your joint feels stiff in the morning, try taking a warm shower.  If directed, apply heat to the joint. If you have diabetes, do not apply heat without permission from your health care provider. ? Put a towel between the joint and the hot pack or heating pad. ? Leave the heat on the area for 20-30 minutes.  If directed, apply ice to the joint: ? Put ice in a plastic bag. ? Place a towel between your skin and the bag. ? Leave the ice on for 20 minutes, 2-3 times per day.  Keep all follow-up visits as told by your health care provider. This is important. Contact a health care provider if:  The pain gets worse.  You have a fever. Get help right away if:  You develop severe joint pain, swelling, or redness.  Many joints become painful and swollen.  You develop severe back pain.  You develop severe weakness in your leg.  You cannot control your bladder or bowels. This information is not intended to replace advice given to you by your health care provider. Make sure you discuss any questions you have  with your health care provider. Document Released: 04/10/2004 Document Revised: 08/09/2015 Document Reviewed: 05/29/2014 Elsevier Interactive Patient Education  2019 [employer].

## 2018-09-06 DIAGNOSIS — F411 Generalized anxiety disorder: Secondary | ICD-10-CM | POA: Insufficient documentation

## 2019-02-01 DIAGNOSIS — R351 Nocturia: Secondary | ICD-10-CM | POA: Insufficient documentation

## 2019-03-02 ENCOUNTER — Other Ambulatory Visit: Payer: Managed Care, Other (non HMO)

## 2019-03-02 ENCOUNTER — Ambulatory Visit: Payer: Managed Care, Other (non HMO) | Attending: Internal Medicine

## 2019-03-02 ENCOUNTER — Other Ambulatory Visit: Payer: Self-pay

## 2019-03-02 DIAGNOSIS — Z20822 Contact with and (suspected) exposure to covid-19: Secondary | ICD-10-CM

## 2019-03-04 LAB — NOVEL CORONAVIRUS, NAA: SARS-CoV-2, NAA: NOT DETECTED

## 2019-04-29 DIAGNOSIS — E559 Vitamin D deficiency, unspecified: Secondary | ICD-10-CM | POA: Insufficient documentation

## 2019-04-29 DIAGNOSIS — N401 Enlarged prostate with lower urinary tract symptoms: Secondary | ICD-10-CM | POA: Insufficient documentation

## 2019-08-09 ENCOUNTER — Ambulatory Visit: Payer: Managed Care, Other (non HMO)

## 2019-08-09 ENCOUNTER — Encounter: Payer: Self-pay | Admitting: Orthopaedic Surgery

## 2019-08-09 ENCOUNTER — Other Ambulatory Visit: Payer: Self-pay

## 2019-08-09 ENCOUNTER — Ambulatory Visit: Payer: Managed Care, Other (non HMO) | Admitting: Orthopaedic Surgery

## 2019-08-09 VITALS — BP 144/96 | HR 102 | Ht 73.0 in | Wt 330.0 lb

## 2019-08-09 DIAGNOSIS — Z6841 Body Mass Index (BMI) 40.0 and over, adult: Secondary | ICD-10-CM | POA: Diagnosis not present

## 2019-08-09 DIAGNOSIS — M25561 Pain in right knee: Secondary | ICD-10-CM | POA: Diagnosis not present

## 2019-08-09 DIAGNOSIS — G8929 Other chronic pain: Secondary | ICD-10-CM

## 2019-08-09 NOTE — Progress Notes (Signed)
Subjective:    Patient ID: Dan Castaneda, male    DOB: 09-20-1975, 44 y.o.   MRN: [id number]  HPI He had been to the beach and was coming home.  He stopped in [place], [place] to go to bathroom and felt sudden severe pain in the right knee, it popped.  He had swelling.  He could hardly walk.  He did not twist it, did not fall, did nothing untoward.  He went to the ER there and was seen. I have reviewed the notes.  No x-rays were done.  He has anterior and medial pain as well as some pain posteriorly. He is using a cane.  Nothing seems to help it.  He has tried ice, heat, rest, elevation and Advil.  He has had pain in both knees on and off for several years but no pain like this.  His knee gives way now also.   Review of Systems  Constitutional: Positive for activity change.  Musculoskeletal: Positive for arthralgias, gait problem and joint swelling.  All other systems reviewed and are negative.  For Review of Systems, all other systems reviewed and are negative.  The following is a summary of the past history medically, past history surgically, known current medicines, social history and family history.  This information is gathered electronically by the computer from prior information and documentation.  I review this each visit and have found including this information at this point in the chart is beneficial and informative.   Past Medical History:  Diagnosis Date  . Anxiety   . GERD (gastroesophageal reflux disease)   . Hypertension     Past Surgical History:  Procedure Laterality Date  . CHOLECYSTECTOMY      Current Outpatient Medications on File Prior to Visit  Medication Sig Dispense Refill  . enalapril (VASOTEC) 2.5 MG tablet Take by mouth.    . ALPRAZolam (XANAX) 0.5 MG tablet Take 0.5 mg by mouth 2 (two) times daily as needed for anxiety or sleep.   3  . cyclobenzaprine (FLEXERIL) 10 MG tablet Take 10 mg by mouth 3 (three) times daily as needed for muscle spasms.   1  .  diclofenac (VOLTAREN) 50 MG EC tablet Take 1 tablet (50 mg total) by mouth 2 (two) times daily. 60 tablet 5  . DULoxetine (CYMBALTA) 60 MG capsule Take 60 mg by mouth at bedtime.  2  . hydrochlorothiazide (HYDRODIURIL) 25 MG tablet Take 25 mg by mouth daily.    Marland Kitchen HYDROcodone-acetaminophen (NORCO) 7.5-325 MG tablet Take 1 tablet by mouth 4 (four) times daily as needed.    . loratadine (CLARITIN) 10 MG tablet Take 10 mg by mouth at bedtime.  11  . ondansetron (ZOFRAN) 4 MG tablet Take 1 tablet (4 mg total) by mouth every 6 (six) hours. (Patient not taking: Reported on 08/23/2018) 12 tablet 0  . tamsulosin (FLOMAX) 0.4 MG CAPS capsule Take 0.4 mg by mouth daily.  4   No current facility-administered medications on file prior to visit.    Social History   Socioeconomic History  . Marital status: Married    Spouse name: Not on file  . Number of children: Not on file  . Years of education: Not on file  . Highest education level: Not on file  Occupational History  . Not on file  Tobacco Use  . Smoking status: Former Games developer  . Smokeless tobacco: Never Used  Substance and Sexual Activity  . Alcohol use: No  . Drug use: No  .  Sexual activity: Not on file  Other Topics Concern  . Not on file  Social History Narrative  . Not on file   Social Determinants of Health   Financial Resource Strain:   . Difficulty of Paying Living Expenses:   Food Insecurity:   . Worried About Charity fundraiser in the Last Year:   . Arboriculturist in the Last Year:   Transportation Needs:   . Film/video editor (Medical):   Marland Kitchen Lack of Transportation (Non-Medical):   Physical Activity:   . Days of Exercise per Week:   . Minutes of Exercise per Session:   Stress:   . Feeling of Stress :   Social Connections:   . Frequency of Communication with Friends and Family:   . Frequency of Social Gatherings with Friends and Family:   . Attends Religious Services:   . Active Member of Clubs or Organizations:    . Attends Archivist Meetings:   Marland Kitchen Marital Status:   Intimate Partner Violence:   . Fear of Current or Ex-Partner:   . Emotionally Abused:   Marland Kitchen Physically Abused:   . Sexually Abused:     Family History  Problem Relation Age of Onset  . Diabetes Mother   . Heart disease Mother   . Hypertension Mother   . Cancer Father   . Diabetes Father   . Heart disease Father   . Hypertension Father     BP (!) 144/96   Pulse (!) 102   Ht 6\' 1"  (1.854 m)   Wt (!) 330 lb (149.7 kg)   BMI 43.54 kg/m   Body mass index is 43.54 kg/m.     Objective:   Physical Exam Vitals and nursing note reviewed.  Constitutional:      Appearance: He is well-developed.  HENT:     Head: Normocephalic and atraumatic.  Eyes:     Conjunctiva/sclera: Conjunctivae normal.     Pupils: Pupils are equal, round, and reactive to light.  Cardiovascular:     Rate and Rhythm: Normal rate and regular rhythm.  Pulmonary:     Effort: Pulmonary effort is normal.  Abdominal:     Palpations: Abdomen is soft.  Musculoskeletal:     Cervical back: Normal range of motion and neck supple.       Legs:  Skin:    General: Skin is warm and dry.  Neurological:     Mental Status: He is alert and oriented to person, place, and time.     Cranial Nerves: No cranial nerve deficit.     Motor: No abnormal muscle tone.     Coordination: Coordination normal.     Deep Tendon Reflexes: Reflexes are normal and symmetric. Reflexes normal.  Psychiatric:        Behavior: Behavior normal.        Thought Content: Thought content normal.        Judgment: Judgment normal.   x-rays were done of the right knee, reported separately.        Assessment & Plan:   Encounter Diagnoses  Name Primary?  . Chronic pain of right knee Yes  . Body mass index 40.0-44.9, adult ([place])   . Morbid obesity ([place])    Return in two weeks. Try to get MRI of the right knee.  I am concerned about medial meniscus tear.  He is in pain and  has giving way, using cane.  PROCEDURE NOTE:  The patient requests injections of the  right knee , verbal consent was obtained.  The right knee was prepped appropriately after time out was performed.   Sterile technique was observed and injection of 1 cc of Depo-Medrol 40 mg with several cc's of plain xylocaine. Anesthesia was provided by ethyl chloride and a 20-gauge needle was used to inject the knee area. The injection was tolerated well.  A band aid dressing was applied.  The patient was advised to apply ice later today and tomorrow to the injection sight as needed.  Take Aleve one bid.  He has reflux and cannot take NSAIDs of larger dose.  Stop Aleve if it makes his stomach worse.  Call if any problem.  Precautions discussed.   Electronically Signed Darreld Mclean, MD 5/25/20219:26 AM

## 2019-08-22 ENCOUNTER — Ambulatory Visit
Admission: RE | Admit: 2019-08-22 | Discharge: 2019-08-22 | Disposition: A | Discharge status: Home / Self Care | Payer: Managed Care, Other (non HMO) | Source: Ambulatory Visit | Attending: Orthopaedic Surgery | Admitting: Orthopaedic Surgery

## 2019-08-22 ENCOUNTER — Other Ambulatory Visit: Payer: Self-pay

## 2019-08-22 DIAGNOSIS — G8929 Other chronic pain: Secondary | ICD-10-CM

## 2019-08-24 ENCOUNTER — Ambulatory Visit: Payer: Managed Care, Other (non HMO) | Admitting: Orthopedic Surgery

## 2019-08-25 ENCOUNTER — Other Ambulatory Visit: Payer: Self-pay

## 2019-08-25 ENCOUNTER — Encounter: Payer: Self-pay | Admitting: Orthopaedic Surgery

## 2019-08-25 ENCOUNTER — Ambulatory Visit: Payer: Managed Care, Other (non HMO) | Admitting: Orthopaedic Surgery

## 2019-08-25 VITALS — BP 165/109 | HR 106 | Ht 73.0 in | Wt 330.0 lb

## 2019-08-25 DIAGNOSIS — K219 Gastro-esophageal reflux disease without esophagitis: Secondary | ICD-10-CM | POA: Insufficient documentation

## 2019-08-25 DIAGNOSIS — G8929 Other chronic pain: Secondary | ICD-10-CM

## 2019-08-25 DIAGNOSIS — Z6841 Body Mass Index (BMI) 40.0 and over, adult: Secondary | ICD-10-CM | POA: Diagnosis not present

## 2019-08-25 DIAGNOSIS — M25561 Pain in right knee: Secondary | ICD-10-CM

## 2019-08-25 DIAGNOSIS — I10 Essential (primary) hypertension: Secondary | ICD-10-CM | POA: Insufficient documentation

## 2019-08-25 DIAGNOSIS — M5020 Other cervical disc displacement, unspecified cervical region: Secondary | ICD-10-CM | POA: Insufficient documentation

## 2019-08-25 DIAGNOSIS — M5136 Other intervertebral disc degeneration, lumbar region: Secondary | ICD-10-CM | POA: Insufficient documentation

## 2019-08-25 DIAGNOSIS — M51369 Other intervertebral disc degeneration, lumbar region without mention of lumbar back pain or lower extremity pain: Secondary | ICD-10-CM | POA: Insufficient documentation

## 2019-08-25 DIAGNOSIS — R7301 Impaired fasting glucose: Secondary | ICD-10-CM | POA: Insufficient documentation

## 2019-08-25 NOTE — Progress Notes (Signed)
Patient Dan Castaneda, male DOB:[birth year], 44 y.o. URK:[id number]  Chief Complaint  Patient presents with  . Knee Pain    right/ review MRI     HPI  Dan Castaneda is a 44 y.o. male who has pain of the right knee and giving way.  He had MRI which showed: IMPRESSION: 1. Fairly extensive inferior articular surface and free edge tear involving the posterior horn and mid body region of the lateral meniscus. 2. Intact ligamentous structures and no acute bony findings. 3. 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 Chondral flap type tear suspected. 4. Small joint effusion and small Baker's cyst.  I have independently reviewed the MRI.  I have explained the findings to him.  I recommend arthroscopy.  I have explained the procedure to him.  I will have him see Dr. Romeo Apple.  He is agreeable.   Body mass index is 43.54 kg/m.  The patient meets the AMA guidelines for Morbid (severe) obesity with a BMI > 40.0 and I have recommended weight loss.   ROS  Review of Systems  All other systems reviewed and are negative.  The following is a summary of the past history medically, past history surgically, known current medicines, social history and family history.  This information is gathered electronically by the computer from prior information and documentation.  I review this each visit and have found including this information at this point in the chart is beneficial and informative.    Past Medical History:  Diagnosis Date  . Anxiety   . GERD (gastroesophageal reflux disease)   . Hypertension     Past Surgical History:  Procedure Laterality Date  . CHOLECYSTECTOMY      Family History  Problem Relation Age of Onset  . Diabetes Mother   . Heart disease Mother   . Hypertension Mother   . Cancer Father   . Diabetes Father   . Heart disease Father   . Hypertension Father     Social History Social  History   Tobacco Use  . Smoking status: Former Games developer  . Smokeless tobacco: Never Used  Substance Use Topics  . Alcohol use: No  . Drug use: No    Allergies  Allergen Reactions  . Codeine Hives    Current Outpatient Medications  Medication Sig Dispense Refill  . ALPRAZolam (XANAX) 0.5 MG tablet Take 0.5 mg by mouth 2 (two) times daily as needed for anxiety or sleep.   3  . cyclobenzaprine (FLEXERIL) 10 MG tablet Take 10 mg by mouth 3 (three) times daily as needed for muscle spasms.   1  . diclofenac (VOLTAREN) 50 MG EC tablet Take 1 tablet (50 mg total) by mouth 2 (two) times daily. 60 tablet 5  . DULoxetine (CYMBALTA) 60 MG capsule Take 60 mg by mouth at bedtime.  2  . enalapril (VASOTEC) 2.5 MG tablet Take by mouth.    Marland Kitchen HYDROcodone-acetaminophen (NORCO) 7.5-325 MG tablet Take 1 tablet by mouth 4 (four) times daily as needed.    . lansoprazole (PREVACID) 30 MG capsule Take by mouth.    . loratadine (CLARITIN) 10 MG tablet Take 10 mg by mouth at bedtime.  11  . ondansetron (ZOFRAN) 4 MG tablet Take 1 tablet (4 mg total) by mouth every 6 (six) hours. 12 tablet 0  . tamsulosin (FLOMAX) 0.4 MG CAPS capsule Take 0.4 mg by mouth daily.  4  . hydrochlorothiazide (HYDRODIURIL) 25 MG tablet Take 25 mg by  mouth daily. (Patient not taking: Reported on 08/25/2019)     No current facility-administered medications for this visit.     Physical Exam  Blood pressure (!) 165/109, pulse (!) 106, height 6\' 1"  (1.854 m), weight (!) 330 lb (149.7 kg).  Constitutional: overall normal hygiene, normal nutrition, well developed, normal grooming, normal body habitus. Assistive device:cane  Musculoskeletal: gait and station Limp right, muscle tone and strength are normal, no tremors or atrophy is present.  .  Neurological: coordination overall normal.  Deep tendon reflex/nerve stretch intact.  Sensation normal.  Cranial nerves II-XII intact.   Skin:   Normal overall no scars, lesions, ulcers or  rashes. No psoriasis.  Psychiatric: Alert and oriented x 3.  Recent memory intact, remote memory unclear.  Normal mood and affect. Well groomed.  Good eye contact.  Cardiovascular: overall no swelling, no varicosities, no edema bilaterally, normal temperatures of the legs and arms, no clubbing, cyanosis and good capillary refill.  Lymphatic: palpation is normal.  Right knee pain, lateral pain, effusion, crepitus, limp right, positive lateral [place].  NV intact.  All other systems reviewed and are negative   The patient has been educated about the nature of the problem(s) and counseled on treatment options.  The patient appeared to understand what I have discussed and is in agreement with it.  Encounter Diagnoses  Name Primary?  . Chronic pain of right knee Yes  . Body mass index 40.0-44.9, adult ([place])   . Morbid obesity ([place])     PLAN Call if any problems.  Precautions discussed.  Continue current medications.   Return to clinic to see Dr. Aline Brochure for surgery   Electronically Signed Sanjuana Kava, MD 6/10/20218:34 AM

## 2019-08-25 NOTE — Patient Instructions (Signed)
To see Dr. Harrison for possible surgery 

## 2019-09-14 ENCOUNTER — Ambulatory Visit: Payer: Managed Care, Other (non HMO) | Admitting: Orthopedic Surgery

## 2019-09-14 ENCOUNTER — Encounter: Payer: Self-pay | Admitting: Orthopedic Surgery

## 2019-09-14 ENCOUNTER — Other Ambulatory Visit: Payer: Self-pay

## 2019-09-14 VITALS — BP 156/103 | HR 94 | Ht 73.0 in | Wt 337.0 lb

## 2019-09-14 DIAGNOSIS — Z6841 Body Mass Index (BMI) 40.0 and over, adult: Secondary | ICD-10-CM | POA: Diagnosis not present

## 2019-09-14 DIAGNOSIS — G8929 Other chronic pain: Secondary | ICD-10-CM | POA: Diagnosis not present

## 2019-09-14 DIAGNOSIS — M25561 Pain in right knee: Secondary | ICD-10-CM

## 2019-09-14 NOTE — Progress Notes (Signed)
PREOP consult requested by Dr Hilda Lias for possible surgery   [facility]-  Patient ID: Dan Castaneda, male   DOB: 1975-07-26, 44 y.o.   MRN: [id number]   Chief Complaint  Patient presents with  . Knee Pain    right knee/ hurting some this morning; here to discuss surgery    HPI Dan Castaneda is a 44 y.o. male.  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  Denies numbness or tingling other than from pain from his back but currently denies any radicular symptoms 2.  No evidence of fever    has a past medical history of Anxiety, GERD (gastroesophageal reflux disease), and Hypertension.    Past Surgical History:  Procedure Laterality Date  . CHOLECYSTECTOMY       Family History  Problem Relation Age of Onset  . Diabetes Mother   . Heart disease Mother   . Hypertension Mother   . Cancer Father   . Diabetes Father   . Heart disease Father   . Hypertension Father     Social History   Tobacco Use  . Smoking status: Former Games developer  . Smokeless tobacco: Never Used  Substance Use Topics  . Alcohol use: No  . Drug use: No    Allergies  Allergen Reactions  . Codeine Hives     Current Outpatient Medications:  .  ALPRAZolam (XANAX) 0.5 MG tablet, Take 0.5 mg by mouth 2 (two) times daily as needed for anxiety or sleep. , Disp: , Rfl: 3 .  cyclobenzaprine (FLEXERIL) 10 MG tablet, Take 10 mg by mouth 3 (three) times daily as needed for muscle spasms. , Disp: , Rfl: 1 .  diclofenac (VOLTAREN) 50 MG EC tablet, Take 1 tablet (50 mg total) by mouth 2 (two) times daily., Disp: 60 tablet,  Rfl: 5 .  DULoxetine (CYMBALTA) 60 MG capsule, Take 60 mg by mouth at bedtime., Disp: , Rfl: 2 .  enalapril (VASOTEC) 2.5 MG tablet, Take by mouth., Disp: , Rfl:  .  hydrochlorothiazide (HYDRODIURIL) 25 MG tablet, Take 25 mg by mouth daily. , Disp: , Rfl:  .  HYDROcodone-acetaminophen (NORCO) 7.5-325 MG tablet, Take 1 tablet by mouth 4 (four) times daily as needed., Disp: , Rfl:  .  lansoprazole (PREVACID) 30 MG capsule, Take by mouth., Disp: , Rfl:  .  loratadine (CLARITIN) 10 MG tablet, Take 10 mg by mouth at bedtime., Disp: , Rfl: 11 .  ondansetron (ZOFRAN) 4 MG tablet, Take 1 tablet (4 mg total) by mouth every 6 (six) hours., Disp: 12 tablet, Rfl: 0 .  tamsulosin (FLOMAX) 0.4 MG CAPS capsule, Take 0.4 mg by mouth daily., Disp: , Rfl: 4     Physical Exam-Detailed Physical Exam  BP (!) 156/103   Pulse 94   Ht 6\' 1"  (1.854 m)   Wt (!) 337 lb (152.9 kg)   BMI 44.46 kg/m  Body mass index is 44.46 kg/m. The patient meets the AMA guidelines for Morbid (severe) obesity with a BMI > 40.0 and I have recommended weight loss.  Gen.  appearance normal endomorphic Cardiovascular exam the pulses are 2+ with no peripheral edema  The ambulatory status is supported by cane  Extremity examined right lower  Lateral joint line tenderness with some medial joint line tenderness decreased range of motion joint effusion strength and stability are normal  I Sensation to touch is normal The patient is oriented to person place and time The patient's mood and affect show no depression or anxiety or agitation  Other extremity :   Left lower Inspection was normal  Range of motion assessment full range of motion as recorded Stability assessment stability test reveal no instability or laxity Muscle strength and muscle tone are normal with no atrophy or tremors Skin there are no scars rashes lesions or lacerations   MEDICAL DECISION MAKING (minimum/low)  Data Reviewed  I have personally  reviewed the imaging studies and the report and my interpretation is:  X-ray shows reasonable alignment arthritis lateral compartment and MRI shows torn lateral meniscus 3 compartment arthrosis possible chondral flap tear lateral compartment  Assessment and Plan  Osteoarthritis right knee 3 compartments  Torn lateral meniscus right knee  Note of disc disease lumbar spine  The procedure has been fully reviewed with the patient; The risks and benefits of surgery have been discussed and explained and understood. Alternative treatment has also been reviewed, questions were encouraged and answered. The postoperative plan is also been reviewed.   Arthroscopy right knee lateral meniscectomy  Patient does understand that he may have some arthritis pain after surgery and he could even have some giving way symptoms if the giving way is coming from his lumbar spine degenerative condition  Fuller Canada 09/14/2019, 10:30 AM   Vickki Hearing MD

## 2019-09-14 NOTE — Patient Instructions (Signed)
Knee Arthroscopy  Knee arthroscopy is a surgery to examine the inside of the knee joint and repair any damage to cartilage, surfaces, and other soft tissues around the joint. You may have this surgery if non-surgical treatment has not relieved your symptoms. Knee arthroscopy may be used to:   Repair a torn ligament or other torn tissues. Ligaments are tissues that connect bones to each other.   Remove bone fragments.   Remove a fluid-filled sac (cyst).   Treat kneecap (patella)problems.   Treat an advanced infection in the knee (septicknee).  Arthroscopic surgery is done using a thin tube that has a light and camera on the end of it (arthroscope). The arthroscope is placed through a small incision, and the camera sends images to a screen in the operating room. The images are used to help perform the surgery.  Tell a health care provider about:   Any allergies you have.   All medicines you are taking, including vitamins, herbs, eye drops, creams, and over-the-counter medicines.   Any problems you or family members have had with anesthetic medicines.   Any blood disorders you have.   Any surgeries you have had.   Any medical conditions you have.   Whether you are pregnant or may be pregnant.  What are the risks?  Generally, this is a safe procedure. However, problems may occur, including:   Infection.   Bleeding.   Allergic reactions to medicines.   Damage to blood vessels, nerves, or tissues in the knee.   A blood clot that forms in the leg and travels to the lung (pulmonary embolism).   Failure of the surgery to relieve symptoms.   Knee stiffness.  What happens before the procedure?  Staying hydrated  Follow instructions from your health care provider about hydration, which may include:   Up to 2 hours before the procedure - you may continue to drink clear liquids, such as water, clear fruit juice, black coffee, and plain tea.  Eating and drinking restrictions  Follow instructions from your  health care provider about eating and drinking, which may include:   8 hours before the procedure - stop eating heavy meals or foods such as meat, fried foods, or fatty foods.   6 hours before the procedure - stop eating light meals or foods, such as toast or cereal.   6 hours before the procedure - stop drinking milk or drinks that contain milk.   2 hours before the procedure - stop drinking clear liquids.  Medicines  Ask your health care provider about:   Changing or stopping your regular medicines. This is especially important if you are taking diabetes medicines or blood thinners.   Taking medicines such as aspirin and ibuprofen. These medicines can thin your blood. Do not take these medicines unless your health care provider tells you to take them.   Taking over-the-counter medicines, vitamins, herbs, and supplements.  Testing   Your knee may be examined. Your health care provider may move your knee or ask you to move it in specific ways to see how much motion you have.   You may have blood tests.   You may have imaging tests, such as an X-ray, MRI, or CT scan.   You may have an electrocardiogram. This test records the electrical activity in the heart.  General instructions   Do not drink alcohol unless your health care provider says that you can.   Do not use any products that contain nicotine or tobacco, such   least 24 hours after you leave the hospital or clinic. This is important. What happens during the procedure?   To lower your risk of infection: ? Your health care team will wash or sanitize their hands. ? Hair may be removed from the surgical area. ? Your skin will be washed with soap.  An IV will be inserted into one of your  veins.  You will be given one or more of the following: ? A medicine to help you relax (sedative). ? A medicine to numb the knee area (local anesthetic). ? A medicine to make you fall asleep (general anesthetic). ? A medicine that is injected into an area of your body to numb everything below the injection site (regional anesthetic). This may be injected into your groin or thigh.  A cuff may be placed around your upper leg to slow blood flow to your lower leg during the procedure.  Several small incisions will be made around your knee.  Your knee joint will be rinsed (flushed) and filled with a germ-free solution (sterile saline). This expands the area to allow your surgeon to see the joint more clearly.  An arthroscope will be passed through one of your incisions, into your knee joint.  Other surgical instruments will be passed through the other incisions. Then, your surgeon will examine and repair your knee as needed.  The sterile saline will be drained from your knee, and the cuff will be removed from your upper leg.  Your incisions will be closed with adhesive strips or stitches (sutures) and covered with a bandage (dressing). The procedure may vary among health care providers and hospitals. What happens after the procedure?  Your blood pressure, heart rate, breathing rate, and blood oxygen level will be monitored until the medicines you were given have worn off.  You will be given pain medicine as needed.  You may be given medicine to lower your risk of blood clots.  You may have to wear compression stockings. These stockings help to prevent blood clots and reduce swelling in your legs.  Your health care provider will give you instructions about how much body weight you can safely support on your leg (weight-bearing restrictions). You may be given crutches or other devices to help you move around (assistive devices).  You may be shown how to do physical therapy exercises to  help you recover.  Do not drive until your health care provider approves. Summary  Knee arthroscopy is a surgery to examine or repair the inside of the knee joint.  Before the procedure, follow instructions from your health care provider about eating and drinking.  Plan to have someone take you home from the hospital or clinic. This information is not intended to replace advice given to you by your health care provider. Make sure you discuss any questions you have with your health care provider. Document Revised: 02/13/2017 Document Reviewed: 12/04/2016 Elsevier Patient Education  2020 [employer].

## 2019-09-23 NOTE — Patient Instructions (Signed)
Dan Castaneda  09/23/2019     @PREFPERIOPPHARMACY @   Your procedure is scheduled on   09/29/2019   Report to [facility] at  0700  A.M.  Call this number if you have problems the morning of surgery:  [phone]   Remember:  Do not eat or drink after midnight.                        Take these medicines the morning of surgery with A SIP OF WATER Xanax(if needed), flexeril(if needed),cymbalta, hydrocodone(if needed), prevacid, claritin, zofran(if needed), flomax.    Do not wear jewelry, make-up or nail polish.  Do not wear lotions, powders, or perfumes. Please wear deodorant and brush your teeth.  Do not shave 48 hours prior to surgery.  Men may shave face and neck.  Do not bring valuables to the hospital.  [facility] is not responsible for any belongings or valuables.  Contacts, dentures or bridgework may not be worn into surgery.  Leave your suitcase in the car.  After surgery it may be brought to your room.  For patients admitted to the hospital, discharge time will be determined by your treatment team.  Patients discharged the day of surgery will not be allowed to drive home.   Name and phone number of your driver:   family Special instructions:  DO NOT smoke the morning of your procedure. Please read over the following fact sheets that you were given. Anesthesia Post-op Instructions and Care and Recovery After Surgery       Arthroscopic Knee Ligament Repair, Care After This sheet gives you information about how to care for yourself after your procedure. Your health care provider may also give you more specific instructions. If you have problems or questions, contact your health care provider. What can I expect after the procedure? After the procedure, it is common to have:  Pain in your knee.  Bruising and swelling on your knee, calf, and ankle for 3-4 days.  Fatigue. Follow these instructions at home: If you have a brace or immobilizer:  Wear  the brace or immobilizer as told by your health care provider. Remove it only as told by your health care provider.  Loosen the splint or immobilizer if your toes tingle, become numb, or turn cold and blue.  Keep the brace or immobilizer clean. Bathing  Do not take baths, swim, or use a hot tub until your health care provider approves. Ask your health care provider if you can take showers.  Keep your bandage (dressing) dry until your health care provider says that it can be removed. Cover it and your brace or immobilizer with a watertight covering when you take a shower. Incision care   Follow instructions from your health care provider about how to take care of your incision. Make sure you: ? Wash your hands with soap and water before you change your bandage (dressing). If soap and water are not available, use hand sanitizer. ? Change your dressing as told by your health care provider. ? Leave stitches (sutures), skin glue, or adhesive strips in place. These skin closures may need to stay in place for 2 weeks or longer. If adhesive strip edges start to loosen and curl up, you may trim the loose edges. Do not remove adhesive strips completely unless your health care provider tells you to do that.  Check your incision area every day for  signs of infection. Check for: ? More redness, swelling, or pain. ? More fluid or blood. ? Warmth. ? Pus or a bad smell. Managing pain, stiffness, and swelling   If directed, put ice on the affected area. ? If you have a removable brace or immobilizer, remove it as told by your health care provider. ? Put ice in a plastic bag. ? Place a towel between your skin and the bag or between your brace or immobilizer and the bag. ? Leave the ice on for 20 minutes, 2-3 times a day.  Move your toes often to avoid stiffness and to lessen swelling.  Raise (elevate) the injured area above the level of your heart while you are sitting or lying down. Driving  Do  not drive until your health care provider approves. If you have a brace or immobilizer on your leg, ask your health care provider when it is safe for you to drive.  Do not drive or use heavy machinery while taking prescription pain medicine. Activity  Rest as directed. Ask your health care provider what activities are safe for you.  Do physical therapy exercises as told by your health care provider. Physical therapy will help you regain strength and motion in your knee.  Follow instructions from your health care provider about: ? When you may start motion exercises. ? When you may start riding a stationary bike and doing other low-impact activities. ? When you may start to jog and do other high-impact activities. Safety  Do not use the injured limb to support your body weight until your health care provider says that you can. Use crutches as told by your health care provider. General instructions  Do not use any products that contain nicotine or tobacco, such as cigarettes and e-cigarettes. These can delay bone healing. If you need help quitting, ask your health care provider.  To prevent or treat constipation while you are taking prescription pain medicine, your health care provider may recommend that you: ? Drink enough fluid to keep your urine clear or pale yellow. ? Take over-the-counter or prescription medicines. ? Eat foods that are high in fiber, such as fresh fruits and vegetables, whole grains, and beans. ? Limit foods that are high in fat and processed sugars, such as fried and sweet foods.  Take over-the-counter and prescription medicines only as told by your health care provider.  Keep all follow-up visits as told by your health care provider. This is important. Contact a health care provider if:  You have more redness, swelling, or pain around an incision.  You have more fluid or blood coming from an incision.  Your incision feels warm to the touch.  You have a  fever.  You have pain or swelling in your knee, and it gets worse.  You have pain that does not get better with medicine. Get help right away if:  You have trouble breathing.  You have pus or a bad smell coming from an incision.  You have numbness and tingling near the knee joint. Summary  After the procedure, it is common to have knee pain with bruising and swelling on your knee, calf, and ankle.  Icing your knee and raising your leg above the level of your heart will help control the pain and the swelling.  Do physical therapy exercises as told by your health care provider. Physical therapy will help you regain strength and motion in your knee. This information is not intended to replace advice given to you  by your health care provider. Make sure you discuss any questions you have with your health care provider. Document Revised: 02/13/2017 Document Reviewed: 02/26/2016 Elsevier Patient Education  2020 [employer]  Spinal Anesthesia and Epidural Anesthesia, Care After This sheet gives you information about how to care for yourself after your procedure. Your doctor may also give you more specific instructions. If you have problems or questions, call your doctor. Follow these instructions at home: For at least 24 hours after the procedure:   Have a responsible adult stay with you. It is important to have someone help care for you until you are awake and alert.  Rest as needed.  Do not do activities where you could fall or get hurt (injured).  Do not drive.  Do not use heavy machinery.  Do not drink alcohol.  Do not take sleeping pills or medicines that make you sleepy (drowsy).  Do not make important decisions.  Do not sign legal documents.  Do not take care of children on your own. Eating and drinking  If you throw up (vomit), drink water, juice, or soup when nausea and vomiting stop.  Drink enough fluid to keep your pee (urine) pale yellow.  Make sure you do  not feel like throwing up (nauseous) before you eat solid foods.  Follow the diet that your doctor recommends. General instructions  Return to your normal activities as told by your doctor. Ask your doctor what activities are safe for you.  Take over-the-counter and prescription medicines only as told by your doctor.  If you have sleep apnea, surgery and certain medicines can raise your risk for breathing problems. Follow instructions from your doctor about when to wear your sleep device. Your doctor may tell you to wear your sleep device: ? Anytime you are sleeping, including during daytime naps. ? While taking prescription pain medicines, sleeping pills, or medicines that make you sleepy.  Do not use any products that contain nicotine or tobacco. This includes cigarettes and e-cigarettes. ? If you need help quitting, ask your doctor. ? If you smoke, do not smoke by yourself. Make sure someone is nearby in case you need help.  Keep all follow-up visits as told by your doctor. This is important. Contact a doctor if:  It has been more than one day since your procedure and you feel like throwing up.  It has been more than one day since your procedure and you throw up.  You have a rash. Get help right away if:  You have a fever.  You have a headache that lasts a long time.  You have a very bad headache.  Your vision is blurry.  You see two of a single object (double vision).  You are dizzy or light-headed.  You faint.  Your arms or legs tingle, feel weak, or get numb.  You have trouble breathing.  You cannot pee (urinate). Summary  After the procedure, have a responsible adult stay with you at home until you are fully awake and alert.  Do not do activities that might get you injured. Do not drive, use heavy machinery, drink alcohol, or make important decisions for 24 hours after the procedure.  Take medicines as told by your doctor. Do not use products that contain  nicotine or tobacco.  Get help right away if you have a fever, blurry vision, difficulty breathing or passing urine, or weakness or numbness in arms or legs. This information is not intended to replace advice given to you  by your health care provider. Make sure you discuss any questions you have with your health care provider. Document Revised: 02/13/2017 Document Reviewed: 06/25/2015 Elsevier Patient Education  2020 Else[employer] Inc.  General Anesthesia, Adult, Care After This sheet gives you information about how to care for yourself after your procedure. Your health care provider may also give you more specific instructions. If you have problems or questions, contact your health care provider. What can I expect after the procedure? After the procedure, the following side effects are common:  Pain or discomfort at the IV site.  Nausea.  Vomiting.  Sore throat.  Trouble concentrating.  Feeling cold or chills.  Weak or tired.  Sleepiness and fatigue.  Soreness and body aches. These side effects can affect parts of the body that were not involved in surgery. Follow these instructions at home:  For at least 24 hours after the procedure:  Have a responsible adult stay with you. It is important to have someone help care for you until you are awake and alert.  Rest as needed.  Do not: ? Participate in activities in which you could fall or become injured. ? Drive. ? Use heavy machinery. ? Drink alcohol. ? Take sleeping pills or medicines that cause drowsiness. ? Make important decisions or sign legal documents. ? Take care of children on your own. Eating and drinking  Follow any instructions from your health care provider about eating or drinking restrictions.  When you feel hungry, start by eating small amounts of foods that are soft and easy to digest (bland), such as toast. Gradually return to your regular diet.  Drink enough fluid to keep your urine pale yellow.  If  you vomit, rehydrate by drinking water, juice, or clear broth. General instructions  If you have sleep apnea, surgery and certain medicines can increase your risk for breathing problems. Follow instructions from your health care provider about wearing your sleep device: ? Anytime you are sleeping, including during daytime naps. ? While taking prescription pain medicines, sleeping medicines, or medicines that make you drowsy.  Return to your normal activities as told by your health care provider. Ask your health care provider what activities are safe for you.  Take over-the-counter and prescription medicines only as told by your health care provider.  If you smoke, do not smoke without supervision.  Keep all follow-up visits as told by your health care provider. This is important. Contact a health care provider if:  You have nausea or vomiting that does not get better with medicine.  You cannot eat or drink without vomiting.  You have pain that does not get better with medicine.  You are unable to pass urine.  You develop a skin rash.  You have a fever.  You have redness around your IV site that gets worse. Get help right away if:  You have difficulty breathing.  You have chest pain.  You have blood in your urine or stool, or you vomit blood. Summary  After the procedure, it is common to have a sore throat or nausea. It is also common to feel tired.  Have a responsible adult stay with you for the first 24 hours after general anesthesia. It is important to have someone help care for you until you are awake and alert.  When you feel hungry, start by eating small amounts of foods that are soft and easy to digest (bland), such as toast. Gradually return to your regular diet.  Drink enough fluid to keep  your urine pale yellow.  Return to your normal activities as told by your health care provider. Ask your health care provider what activities are safe for you. This  information is not intended to replace advice given to you by your health care provider. Make sure you discuss any questions you have with your health care provider. Document Revised: 03/06/2017 Document Reviewed: 10/17/2016 Elsevier Patient Education  2020 [employer] How to Use Chlorhexidine for Bathing Chlorhexidine gluconate (CHG) is a germ-killing (antiseptic) solution that is used to clean the skin. It can get rid of the bacteria that normally live on the skin and can keep them away for about 24 hours. To clean your skin with CHG, you may be given:  A CHG solution to use in the shower or as part of a sponge bath.  A prepackaged cloth that contains CHG. Cleaning your skin with CHG may help lower the risk for infection:  While you are staying in the intensive care unit of the hospital.  If you have a vascular access, such as a central line, to provide short-term or long-term access to your veins.  If you have a catheter to drain urine from your bladder.  If you are on a ventilator. A ventilator is a machine that helps you breathe by moving air in and out of your lungs.  After surgery. What are the risks? Risks of using CHG include:  A skin reaction.  Hearing loss, if CHG gets in your ears.  Eye injury, if CHG gets in your eyes and is not rinsed out.  The CHG product catching fire. Make sure that you avoid smoking and flames after applying CHG to your skin. Do not use CHG:  If you have a chlorhexidine allergy or have previously reacted to chlorhexidine.  On babies younger than 352 months of age. How to use CHG solution  Use CHG only as told by your health care provider, and follow the instructions on the label.  Use the full amount of CHG as directed. Usually, this is one bottle. During a shower Follow these steps when using CHG solution during a shower (unless your health care provider gives you different instructions): 1. Start the shower. 2. Use your normal soap and  shampoo to wash your face and hair. 3. Turn off the shower or move out of the shower stream. 4. Pour the CHG onto a clean washcloth. Do not use any type of brush or rough-edged sponge. 5. Starting at your neck, lather your body down to your toes. Make sure you follow these instructions: ? If you will be having surgery, pay special attention to the part of your body where you will be having surgery. Scrub this area for at least 1 minute. ? Do not use CHG on your head or face. If the solution gets into your ears or eyes, rinse them well with water. ? Avoid your genital area. ? Avoid any areas of skin that have broken skin, cuts, or scrapes. ? Scrub your back and under your arms. Make sure to wash skin folds. 6. Let the lather sit on your skin for 1-2 minutes or as long as told by your health care provider. 7. Thoroughly rinse your entire body in the shower. Make sure that all body creases and crevices are rinsed well. 8. Dry off with a clean towel. Do not put any substances on your body afterward--such as powder, lotion, or perfume--unless you are told to do so by your health care provider. Only  use lotions that are recommended by the manufacturer. 9. Put on clean clothes or pajamas. 10. If it is the night before your surgery, sleep in clean sheets.  During a sponge bath Follow these steps when using CHG solution during a sponge bath (unless your health care provider gives you different instructions): 1. Use your normal soap and shampoo to wash your face and hair. 2. Pour the CHG onto a clean washcloth. 3. Starting at your neck, lather your body down to your toes. Make sure you follow these instructions: ? If you will be having surgery, pay special attention to the part of your body where you will be having surgery. Scrub this area for at least 1 minute. ? Do not use CHG on your head or face. If the solution gets into your ears or eyes, rinse them well with water. ? Avoid your genital  area. ? Avoid any areas of skin that have broken skin, cuts, or scrapes. ? Scrub your back and under your arms. Make sure to wash skin folds. 4. Let the lather sit on your skin for 1-2 minutes or as long as told by your health care provider. 5. Using a different clean, wet washcloth, thoroughly rinse your entire body. Make sure that all body creases and crevices are rinsed well. 6. Dry off with a clean towel. Do not put any substances on your body afterward--such as powder, lotion, or perfume--unless you are told to do so by your health care provider. Only use lotions that are recommended by the manufacturer. 7. Put on clean clothes or pajamas. 8. If it is the night before your surgery, sleep in clean sheets. How to use CHG prepackaged cloths  Only use CHG cloths as told by your health care provider, and follow the instructions on the label.  Use the CHG cloth on clean, dry skin.  Do not use the CHG cloth on your head or face unless your health care provider tells you to.  When washing with the CHG cloth: ? Avoid your genital area. ? Avoid any areas of skin that have broken skin, cuts, or scrapes. Before surgery Follow these steps when using a CHG cloth to clean before surgery (unless your health care provider gives you different instructions): 1. Using the CHG cloth, vigorously scrub the part of your body where you will be having surgery. Scrub using a back-and-forth motion for 3 minutes. The area on your body should be completely wet with CHG when you are done scrubbing. 2. Do not rinse. Discard the cloth and let the area air-dry. Do not put any substances on the area afterward, such as powder, lotion, or perfume. 3. Put on clean clothes or pajamas. 4. If it is the night before your surgery, sleep in clean sheets.  For general bathing Follow these steps when using CHG cloths for general bathing (unless your health care provider gives you different instructions). 1. Use a separate CHG  cloth for each area of your body. Make sure you wash between any folds of skin and between your fingers and toes. Wash your body in the following order, switching to a new cloth after each step: ? The front of your neck, shoulders, and chest. ? Both of your arms, under your arms, and your hands. ? Your stomach and groin area, avoiding the genitals. ? Your right leg and foot. ? Your left leg and foot. ? The back of your neck, your back, and your buttocks. 2. Do not rinse. Discard the cloth  and let the area air-dry. Do not put any substances on your body afterward--such as powder, lotion, or perfume--unless you are told to do so by your health care provider. Only use lotions that are recommended by the manufacturer. 3. Put on clean clothes or pajamas. Contact a health care provider if:  Your skin gets irritated after scrubbing.  You have questions about using your solution or cloth. Get help right away if:  Your eyes become very red or swollen.  Your eyes itch badly.  Your skin itches badly and is red or swollen.  Your hearing changes.  You have trouble seeing.  You have swelling or tingling in your mouth or throat.  You have trouble breathing.  You swallow any chlorhexidine. Summary  Chlorhexidine gluconate (CHG) is a germ-killing (antiseptic) solution that is used to clean the skin. Cleaning your skin with CHG may help to lower your risk for infection.  You may be given CHG to use for bathing. It may be in a bottle or in a prepackaged cloth to use on your skin. Carefully follow your health care provider's instructions and the instructions on the product label.  Do not use CHG if you have a chlorhexidine allergy.  Contact your health care provider if your skin gets irritated after scrubbing. This information is not intended to replace advice given to you by your health care provider. Make sure you discuss any questions you have with your health care provider. Document Revised:  05/20/2018 Document Reviewed: 01/29/2017 Elsevier Patient Education  2020 [employer].

## 2019-09-26 ENCOUNTER — Encounter: Payer: Self-pay | Admitting: Radiology

## 2019-09-26 NOTE — Progress Notes (Signed)
CPT [id number] approved # [id number], valid 09/23/19 through 12/22/2019.

## 2019-09-27 ENCOUNTER — Other Ambulatory Visit (HOSPITAL_COMMUNITY): Payer: Managed Care, Other (non HMO)

## 2019-09-27 ENCOUNTER — Encounter (HOSPITAL_COMMUNITY): Admission: RE | Admit: 2019-09-27 | Payer: Managed Care, Other (non HMO) | Source: Ambulatory Visit

## 2019-09-28 ENCOUNTER — Encounter (HOSPITAL_COMMUNITY)
Admission: RE | Admit: 2019-09-28 | Discharge: 2019-09-28 | Disposition: A | Discharge status: Home / Self Care | Payer: Managed Care, Other (non HMO) | Source: Ambulatory Visit | Attending: Orthopedic Surgery | Admitting: Orthopedic Surgery

## 2019-09-28 ENCOUNTER — Encounter (HOSPITAL_COMMUNITY): Payer: Self-pay

## 2019-09-28 ENCOUNTER — Other Ambulatory Visit (HOSPITAL_COMMUNITY)
Admission: RE | Admit: 2019-09-28 | Discharge: 2019-09-28 | Disposition: A | Discharge status: Home / Self Care | Payer: Managed Care, Other (non HMO) | Source: Ambulatory Visit | Attending: Orthopedic Surgery | Admitting: Orthopedic Surgery

## 2019-09-28 ENCOUNTER — Other Ambulatory Visit: Payer: Self-pay

## 2019-09-28 DIAGNOSIS — Z01812 Encounter for preprocedural laboratory examination: Secondary | ICD-10-CM | POA: Diagnosis present

## 2019-09-28 DIAGNOSIS — Z20822 Contact with and (suspected) exposure to covid-19: Secondary | ICD-10-CM | POA: Diagnosis not present

## 2019-09-28 HISTORY — DX: Personal history of urinary calculi: Z87.442

## 2019-09-28 HISTORY — DX: Benign prostatic hyperplasia without lower urinary tract symptoms: N40.0

## 2019-09-28 HISTORY — DX: Unspecified osteoarthritis, unspecified site: M19.90

## 2019-09-28 LAB — CBC WITH DIFFERENTIAL/PLATELET
Abs Immature Granulocytes: 0.14 10*3/uL — ABNORMAL HIGH (ref 0.00–0.07)
Basophils Absolute: 0.1 10*3/uL (ref 0.0–0.1)
Basophils Relative: 1 %
Eosinophils Absolute: 0.1 10*3/uL (ref 0.0–0.5)
Eosinophils Relative: 2 %
HCT: 43.4 % (ref 39.0–52.0)
Hemoglobin: 14.5 g/dL (ref 13.0–17.0)
Immature Granulocytes: 2 %
Lymphocytes Relative: 22 %
Lymphs Abs: 1.6 10*3/uL (ref 0.7–4.0)
MCH: 30.3 pg (ref 26.0–34.0)
MCHC: 33.4 g/dL (ref 30.0–36.0)
MCV: 90.6 fL (ref 80.0–100.0)
Monocytes Absolute: 0.4 10*3/uL (ref 0.1–1.0)
Monocytes Relative: 5 %
Neutro Abs: 5 10*3/uL (ref 1.7–7.7)
Neutrophils Relative %: 68 %
Platelets: 216 10*3/uL (ref 150–400)
RBC: 4.79 MIL/uL (ref 4.22–5.81)
RDW: 13.2 % (ref 11.5–15.5)
WBC: 7.3 10*3/uL (ref 4.0–10.5)
nRBC: 0 % (ref 0.0–0.2)

## 2019-09-28 LAB — BASIC METABOLIC PANEL
Anion gap: 10 (ref 5–15)
BUN: 12 mg/dL (ref 6–20)
CO2: 26 mmol/L (ref 22–32)
Calcium: 9.4 mg/dL (ref 8.9–10.3)
Chloride: 101 mmol/L (ref 98–111)
Creatinine, Ser: 0.76 mg/dL (ref 0.61–1.24)
GFR calc Af Amer: >60 mL/min (ref >60)
GFR calc non Af Amer: >60 mL/min (ref >60)
Glucose, Bld: 125 mg/dL — ABNORMAL HIGH (ref 70–99)
Potassium: 4 mmol/L (ref 3.5–5.1)
Sodium: 137 mmol/L (ref 135–145)

## 2019-09-28 LAB — SARS CORONAVIRUS 2 (TAT 6-24 HRS): SARS Coronavirus 2: NEGATIVE

## 2019-09-28 NOTE — Progress Notes (Signed)
   09/28/19 0825  OBSTRUCTIVE SLEEP APNEA  Have you ever been diagnosed with sleep apnea through a sleep study? No  Do you snore loudly (loud enough to be heard through closed doors)?  1  Do you often feel tired, fatigued, or sleepy during the daytime (such as falling asleep during driving or talking to someone)? 0  Has anyone observed you stop breathing during your sleep? 0  Do you have, or are you being treated for high blood pressure? 1  BMI more than 35 kg/m2? 1  Age > 50 (1-yes) 0  Neck circumference greater than:Male 16 inches or larger, Male 17inches or larger? 0  Male Gender (Yes=1) 1  Obstructive Sleep Apnea Score 4  Score 5 or greater  Results sent to PCP

## 2019-09-28 NOTE — H&P (Signed)
PREOP consult requested by Dr Hilda Lias for possible surgery    [facility]   Patient ID: Dan Castaneda, male   DOB: 01/06/1976, 44 y.o.   MRN: [id number]         Chief Complaint  Patient presents with  . Knee Pain      right knee/ hurting some this morning; here to discuss surgery      HPI Dan Castaneda is a 44 y.o. male.  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  Denies numbness or tingling other than from pain from his back but currently denies any radicular symptoms 2.  No evidence of fever      has a past medical history of Anxiety, GERD (gastroesophageal reflux disease), and Hypertension.          Past Surgical History:  Procedure Laterality Date  . CHOLECYSTECTOMY               Family History  Problem Relation Age of Onset  . Diabetes Mother    . Heart disease Mother    . Hypertension Mother    . Cancer Father    . Diabetes Father    . Heart disease Father    . Hypertension Father        Social History    Tobacco Use  . Smoking status: Former Games developer  . Smokeless tobacco: Never Used  Substance Use Topics  . Alcohol use: No  . Drug use: No          Allergies  Allergen Reactions  . Codeine Hives        Current Outpatient Medications:  .  ALPRAZolam (XANAX) 0.5 MG tablet, Take 0.5 mg by mouth 2 (two) times daily as needed for anxiety or sleep. , Disp: , Rfl: 3 .  cyclobenzaprine (FLEXERIL) 10 MG tablet, Take 10 mg by mouth 3 (three) times daily as needed for muscle spasms. , Disp: , Rfl: 1 .  diclofenac (VOLTAREN) 50 MG EC tablet, Take 1 tablet (50 mg  total) by mouth 2 (two) times daily., Disp: 60 tablet, Rfl: 5 .  DULoxetine (CYMBALTA) 60 MG capsule, Take 60 mg by mouth at bedtime., Disp: , Rfl: 2 .  enalapril (VASOTEC) 2.5 MG tablet, Take by mouth., Disp: , Rfl:  .  hydrochlorothiazide (HYDRODIURIL) 25 MG tablet, Take 25 mg by mouth daily. , Disp: , Rfl:  .  HYDROcodone-acetaminophen (NORCO) 7.5-325 MG tablet, Take 1 tablet by mouth 4 (four) times daily as needed., Disp: , Rfl:  .  lansoprazole (PREVACID) 30 MG capsule, Take by mouth., Disp: , Rfl:  .  loratadine (CLARITIN) 10 MG tablet, Take 10 mg by mouth at bedtime., Disp: , Rfl: 11 .  ondansetron (ZOFRAN) 4 MG tablet, Take 1 tablet (4 mg total) by mouth every 6 (six) hours., Disp: 12 tablet, Rfl: 0 .  tamsulosin (FLOMAX) 0.4 MG CAPS capsule, Take 0.4 mg by mouth daily., Disp: , Rfl: 4        Physical Exam-Detailed Physical Exam  appearance normal endomorphic Cardiovascular exam the pulses are 2+ with no peripheral edema  The ambulatory status is supported by cane  Extremity examined right lower  Lateral joint line tenderness with some medial joint line tenderness decreased range of motion joint effusion strength and stability are normal  I Sensation to touch is normal The patient is oriented to person place and time The patient's mood and affect show no depression or anxiety or agitation  Other extremity :   Left lower Inspection was normal  Range of motion assessment full range of motion as recorded Stability assessment stability test reveal no instability or laxity Muscle strength and muscle tone are normal with no atrophy or tremors Skin there are no scars rashes lesions or lacerations   MEDICAL DECISION MAKING (minimum/low)  Data Reviewed  I have personally  reviewed the imaging studies and the report and my interpretation is:  X-ray shows reasonable alignment arthritis lateral compartment and MRI shows torn lateral meniscus 3 compartment arthrosis possible chondral flap tear lateral compartment  Assessment and Plan  Osteoarthritis right knee 3 compartments  Torn lateral meniscus right knee  Note of disc disease lumbar spine  The procedure has been fully reviewed with the patient; The risks and benefits of surgery have been discussed and explained and understood. Alternative treatment has also been reviewed, questions were encouraged and answered. The postoperative plan is also been reviewed.   Arthroscopy right knee lateral meniscectomy  Patient does understand that he may have some arthritis pain after surgery and he could even have some giving way symptoms if the giving way is coming from his lumbar spine degenerative condition  Markus Casten 09/14/2019, 10:30 AM   Jancie Kercher E Jazlen Ogarro MD  

## 2019-09-29 ENCOUNTER — Ambulatory Visit (HOSPITAL_COMMUNITY): Payer: Managed Care, Other (non HMO) | Admitting: Certified Registered Nurse Anesthetist

## 2019-09-29 ENCOUNTER — Encounter (HOSPITAL_COMMUNITY): Payer: Self-pay | Admitting: Orthopedic Surgery

## 2019-09-29 ENCOUNTER — Ambulatory Visit (HOSPITAL_COMMUNITY)
Admission: RE | Admit: 2019-09-29 | Discharge: 2019-09-29 | Disposition: A | Discharge status: Home / Self Care | Payer: Managed Care, Other (non HMO) | Attending: Orthopedic Surgery | Admitting: Orthopedic Surgery

## 2019-09-29 ENCOUNTER — Encounter (HOSPITAL_COMMUNITY)
Admission: RE | Disposition: A | Discharge status: Home / Self Care | Payer: Self-pay | Source: Home / Self Care | Attending: Orthopedic Surgery

## 2019-09-29 ENCOUNTER — Encounter: Payer: Self-pay | Admitting: Orthopedic Surgery

## 2019-09-29 DIAGNOSIS — M2241 Chondromalacia patellae, right knee: Secondary | ICD-10-CM | POA: Diagnosis not present

## 2019-09-29 DIAGNOSIS — K219 Gastro-esophageal reflux disease without esophagitis: Secondary | ICD-10-CM | POA: Diagnosis not present

## 2019-09-29 DIAGNOSIS — M23300 Other meniscus derangements, unspecified lateral meniscus, right knee: Secondary | ICD-10-CM

## 2019-09-29 DIAGNOSIS — M5136 Other intervertebral disc degeneration, lumbar region: Secondary | ICD-10-CM | POA: Insufficient documentation

## 2019-09-29 DIAGNOSIS — X58XXXA Exposure to other specified factors, initial encounter: Secondary | ICD-10-CM | POA: Diagnosis not present

## 2019-09-29 DIAGNOSIS — M65861 Other synovitis and tenosynovitis, right lower leg: Secondary | ICD-10-CM | POA: Insufficient documentation

## 2019-09-29 DIAGNOSIS — Z79899 Other long term (current) drug therapy: Secondary | ICD-10-CM | POA: Diagnosis not present

## 2019-09-29 DIAGNOSIS — I10 Essential (primary) hypertension: Secondary | ICD-10-CM | POA: Insufficient documentation

## 2019-09-29 DIAGNOSIS — Z87891 Personal history of nicotine dependence: Secondary | ICD-10-CM | POA: Diagnosis not present

## 2019-09-29 DIAGNOSIS — Z791 Long term (current) use of non-steroidal anti-inflammatories (NSAID): Secondary | ICD-10-CM | POA: Insufficient documentation

## 2019-09-29 DIAGNOSIS — M1711 Unilateral primary osteoarthritis, right knee: Secondary | ICD-10-CM | POA: Diagnosis not present

## 2019-09-29 DIAGNOSIS — S83281A Other tear of lateral meniscus, current injury, right knee, initial encounter: Secondary | ICD-10-CM | POA: Diagnosis present

## 2019-09-29 DIAGNOSIS — F419 Anxiety disorder, unspecified: Secondary | ICD-10-CM | POA: Diagnosis not present

## 2019-09-29 HISTORY — PX: KNEE ARTHROSCOPY WITH LATERAL MENISECTOMY: SHX6193

## 2019-09-29 SURGERY — ARTHROSCOPY, KNEE, WITH LATERAL MENISCECTOMY
Anesthesia: General | Site: Knee | Laterality: Right

## 2019-09-29 MED ORDER — IBUPROFEN 800 MG PO TABS
ORAL_TABLET | ORAL | Status: AC
  Filled 2019-09-29: qty 1

## 2019-09-29 MED ORDER — ONDANSETRON HCL 4 MG/2ML IJ SOLN: 4.0000 mg | Freq: Once | INTRAMUSCULAR | Status: DC | PRN

## 2019-09-29 MED ORDER — CEFAZOLIN SODIUM-DEXTROSE 1-4 GM/50ML-% IV SOLN
INTRAVENOUS | Status: AC
  Filled 2019-09-29: qty 50

## 2019-09-29 MED ORDER — PROPOFOL 10 MG/ML IV BOLUS
INTRAVENOUS | Status: DC | PRN
  Administered 2019-09-29: 300 mg via INTRAVENOUS

## 2019-09-29 MED ORDER — LIDOCAINE HCL (CARDIAC) PF 100 MG/5ML IV SOSY
PREFILLED_SYRINGE | INTRAVENOUS | Status: DC | PRN
  Administered 2019-09-29: 100 mg via INTRAVENOUS

## 2019-09-29 MED ORDER — BUPIVACAINE-EPINEPHRINE (PF) 0.5% -1:200000 IJ SOLN
INTRAMUSCULAR | Status: AC
  Filled 2019-09-29: qty 60

## 2019-09-29 MED ORDER — ORAL CARE MOUTH RINSE
15.0000 mL | Freq: Once | OROMUCOSAL | Status: AC
  Administered 2019-09-29: 15 mL via OROMUCOSAL

## 2019-09-29 MED ORDER — FENTANYL CITRATE (PF) 100 MCG/2ML IJ SOLN: 25.0000 ug | INTRAMUSCULAR | Status: DC | PRN

## 2019-09-29 MED ORDER — MIDAZOLAM HCL 2 MG/2ML IJ SOLN
INTRAMUSCULAR | Status: AC
  Filled 2019-09-29: qty 2

## 2019-09-29 MED ORDER — CEFAZOLIN SODIUM-DEXTROSE 1-4 GM/50ML-% IV SOLN
1.0000 g | INTRAVENOUS | Status: AC
  Administered 2019-09-29: 1 g via INTRAVENOUS

## 2019-09-29 MED ORDER — IBUPROFEN 800 MG PO TABS
800.0000 mg | ORAL_TABLET | Freq: Once | ORAL | Status: AC
  Administered 2019-09-29: 800 mg via ORAL

## 2019-09-29 MED ORDER — DEXAMETHASONE SODIUM PHOSPHATE 10 MG/ML IJ SOLN
INTRAMUSCULAR | Status: DC | PRN
Start: 2019-09-29 — End: 2019-09-29
  Administered 2019-09-29: 5 mg via INTRAVENOUS

## 2019-09-29 MED ORDER — IBUPROFEN 800 MG PO TABS
800.0000 mg | ORAL_TABLET | Freq: Three times a day (TID) | ORAL | 1 refills | Status: DC | PRN
Start: 2019-09-29 — End: 2024-02-02

## 2019-09-29 MED ORDER — OXYCODONE HCL 5 MG PO TABS
5.0000 mg | ORAL_TABLET | Freq: Once | ORAL | Status: AC
  Administered 2019-09-29: 5 mg via ORAL

## 2019-09-29 MED ORDER — FENTANYL CITRATE (PF) 100 MCG/2ML IJ SOLN
INTRAMUSCULAR | Status: DC | PRN
  Administered 2019-09-29: 50 ug via INTRAVENOUS
  Administered 2019-09-29 (×2): 25 ug via INTRAVENOUS
  Administered 2019-09-29: 50 ug via INTRAVENOUS

## 2019-09-29 MED ORDER — ONDANSETRON HCL 4 MG/2ML IJ SOLN
4.0000 mg | Freq: Once | INTRAMUSCULAR | Status: AC
  Administered 2019-09-29: 4 mg via INTRAVENOUS

## 2019-09-29 MED ORDER — 0.9 % SODIUM CHLORIDE (POUR BTL) OPTIME
TOPICAL | Status: DC | PRN
  Administered 2019-09-29: 1000 mL

## 2019-09-29 MED ORDER — EPINEPHRINE PF 1 MG/ML IJ SOLN
INTRAMUSCULAR | Status: AC
  Filled 2019-09-29: qty 4

## 2019-09-29 MED ORDER — ONDANSETRON HCL 4 MG/2ML IJ SOLN
INTRAMUSCULAR | Status: DC | PRN
  Administered 2019-09-29: 4 mg via INTRAVENOUS

## 2019-09-29 MED ORDER — BUPIVACAINE-EPINEPHRINE (PF) 0.5% -1:200000 IJ SOLN
INTRAMUSCULAR | Status: DC | PRN
  Administered 2019-09-29: 60 mL via PERINEURAL

## 2019-09-29 MED ORDER — ACETAMINOPHEN 500 MG PO TABS
ORAL_TABLET | ORAL | Status: AC
  Filled 2019-09-29: qty 1

## 2019-09-29 MED ORDER — ACETAMINOPHEN 500 MG PO TABS
500.0000 mg | ORAL_TABLET | Freq: Once | ORAL | Status: AC
  Administered 2019-09-29: 500 mg via ORAL

## 2019-09-29 MED ORDER — DEXTROSE 5 % IV SOLN: 3.0000 g | INTRAVENOUS | Status: DC

## 2019-09-29 MED ORDER — CHLORHEXIDINE GLUCONATE 0.12 % MT SOLN: 15.0000 mL | Freq: Once | OROMUCOSAL | Status: AC

## 2019-09-29 MED ORDER — LIDOCAINE 2% (20 MG/ML) 5 ML SYRINGE
INTRAMUSCULAR | Status: AC
  Filled 2019-09-29: qty 5

## 2019-09-29 MED ORDER — CEFAZOLIN SODIUM-DEXTROSE 2-4 GM/100ML-% IV SOLN
INTRAVENOUS | Status: AC
  Filled 2019-09-29: qty 100

## 2019-09-29 MED ORDER — LACTATED RINGERS IV SOLN: INTRAVENOUS | Status: DC

## 2019-09-29 MED ORDER — PROPOFOL 10 MG/ML IV BOLUS
INTRAVENOUS | Status: AC
  Filled 2019-09-29: qty 20

## 2019-09-29 MED ORDER — MIDAZOLAM HCL 2 MG/2ML IJ SOLN
INTRAMUSCULAR | Status: DC | PRN
  Administered 2019-09-29: 2 mg via INTRAVENOUS

## 2019-09-29 MED ORDER — CEFAZOLIN SODIUM-DEXTROSE 2-4 GM/100ML-% IV SOLN
2.0000 g | INTRAVENOUS | Status: AC
  Administered 2019-09-29: 2 g via INTRAVENOUS

## 2019-09-29 MED ORDER — ONDANSETRON HCL 4 MG/2ML IJ SOLN
INTRAMUSCULAR | Status: AC
  Filled 2019-09-29: qty 2

## 2019-09-29 MED ORDER — OXYCODONE HCL 5 MG PO TABS
ORAL_TABLET | ORAL | Status: AC
  Filled 2019-09-29: qty 1

## 2019-09-29 MED ORDER — FENTANYL CITRATE (PF) 100 MCG/2ML IJ SOLN
INTRAMUSCULAR | Status: AC
  Filled 2019-09-29: qty 2

## 2019-09-29 MED ORDER — SODIUM CHLORIDE 0.9 % IR SOLN
Status: DC | PRN
  Administered 2019-09-29 (×4): 3000 mL

## 2019-09-29 MED ORDER — OXYCODONE-ACETAMINOPHEN 7.5-325 MG PO TABS: 1.0000 | ORAL_TABLET | ORAL | 0 refills | Status: DC | PRN

## 2019-09-29 SURGICAL SUPPLY — 54 items
ABLATOR ASPIRATE 50D MULTI-PRT (SURGICAL WAND) ×2 IMPLANT
BANDAGE ELASTIC 6 VELCRO NS (GAUZE/BANDAGES/DRESSINGS) ×2 IMPLANT
BLADE EXCALIBUR 4.0MM X 13CM (MISCELLANEOUS) ×1
BLADE EXCALIBUR 4.0X13 (MISCELLANEOUS) ×1 IMPLANT
BLADE SHAVER POWERASP 4X13 (MISCELLANEOUS) ×2 IMPLANT
BLADE SURG SZ11 CARB STEEL (BLADE) ×3 IMPLANT
BNDG CMPR STD VLCR NS LF 5.8X6 (GAUZE/BANDAGES/DRESSINGS) ×1
BNDG ELASTIC 6X5.8 VLCR NS LF (GAUZE/BANDAGES/DRESSINGS) ×3 IMPLANT
CLOTH BEACON ORANGE TIMEOUT ST (SAFETY) ×3 IMPLANT
COOLER ICEMAN CLASSIC (MISCELLANEOUS) ×2 IMPLANT
COVER WAND RF STERILE (DRAPES) ×3 IMPLANT
CUFF TOURN SGL QUICK 34 (TOURNIQUET CUFF) ×3
CUFF TRNQT CYL 34X4.125X (TOURNIQUET CUFF) IMPLANT
DECANTER SPIKE VIAL GLASS SM (MISCELLANEOUS) ×6 IMPLANT
DRAPE HALF SHEET 40X57 (DRAPES) ×3 IMPLANT
DURAPREP 26ML APPLICATOR (WOUND CARE) ×2 IMPLANT
EXCALIBUR CVD 4.0MM X 13CM (MISCELLANEOUS) ×2 IMPLANT
GAUZE 4X4 16PLY RFD (DISPOSABLE) ×3 IMPLANT
GAUZE SPONGE 4X4 12PLY STRL (GAUZE/BANDAGES/DRESSINGS) ×3 IMPLANT
GAUZE XEROFORM 5X9 LF (GAUZE/BANDAGES/DRESSINGS) ×3 IMPLANT
GLOVE BIOGEL PI IND STRL 7.0 (GLOVE) ×1 IMPLANT
GLOVE BIOGEL PI INDICATOR 7.0 (GLOVE) ×4
GLOVE ECLIPSE 7.0 STRL STRAW (GLOVE) ×2 IMPLANT
GLOVE SKINSENSE NS SZ8.0 LF (GLOVE) ×2
GLOVE SKINSENSE STRL SZ8.0 LF (GLOVE) ×1 IMPLANT
GLOVE SS N UNI LF 8.5 STRL (GLOVE) ×3 IMPLANT
GOWN STRL REUS W/TWL LRG LVL3 (GOWN DISPOSABLE) ×3 IMPLANT
GOWN STRL REUS W/TWL XL LVL3 (GOWN DISPOSABLE) ×3 IMPLANT
IV NS IRRIG 3000ML ARTHROMATIC (IV SOLUTION) ×10 IMPLANT
KIT BLADEGUARD II DBL (SET/KITS/TRAYS/PACK) ×3 IMPLANT
KIT TURNOVER CYSTO (KITS) ×3 IMPLANT
MANIFOLD NEPTUNE II (INSTRUMENTS) ×3 IMPLANT
MARKER SKIN DUAL TIP RULER LAB (MISCELLANEOUS) ×3 IMPLANT
NDL HYPO 18GX1.5 BLUNT FILL (NEEDLE) ×1 IMPLANT
NDL HYPO 21X1.5 SAFETY (NEEDLE) ×1 IMPLANT
NDL SPNL 18GX3.5 QUINCKE PK (NEEDLE) ×1 IMPLANT
NEEDLE HYPO 18GX1.5 BLUNT FILL (NEEDLE) ×3 IMPLANT
NEEDLE HYPO 21X1.5 SAFETY (NEEDLE) ×3 IMPLANT
NEEDLE SPNL 18GX3.5 QUINCKE PK (NEEDLE) ×3 IMPLANT
NS IRRIG 1000ML POUR BTL (IV SOLUTION) ×3 IMPLANT
PACK ARTHROSCOPY WL (CUSTOM PROCEDURE TRAY) ×2 IMPLANT
PAD ABD 5X9 TENDERSORB (GAUZE/BANDAGES/DRESSINGS) ×3 IMPLANT
PAD ARMBOARD 7.5X6 YLW CONV (MISCELLANEOUS) ×3 IMPLANT
PAD COLD SHLDR WRAP-ON (PAD) ×2 IMPLANT
PADDING CAST COTTON 6X4 STRL (CAST SUPPLIES) ×3 IMPLANT
PADDING WEBRIL 6 STERILE (GAUZE/BANDAGES/DRESSINGS) ×2 IMPLANT
PORT APPOLLO RF 90DEGREE MULTI (SURGICAL WAND) ×2 IMPLANT
SET ARTHROSCOPY INST (INSTRUMENTS) ×3 IMPLANT
SET BASIN LINEN APH (SET/KITS/TRAYS/PACK) ×3 IMPLANT
SUT ETHILON 3 0 FSL (SUTURE) ×3 IMPLANT
SYR 30ML LL (SYRINGE) ×3 IMPLANT
TUBE CONNECTING 12'X1/4 (SUCTIONS) ×2
TUBE CONNECTING 12X1/4 (SUCTIONS) ×4 IMPLANT
TUBING IN/OUT FLOW W/MAIN PUMP (TUBING) ×3 IMPLANT

## 2019-09-29 NOTE — Op Note (Signed)
09/29/2019  10:59 AM  PATIENT:  Dan Castaneda  44 y.o. male  PRE-OPERATIVE DIAGNOSIS:  LATERAL MENISCOUS TEAR  POST-OPERATIVE DIAGNOSIS:  LATERAL MENISCOUS TEAR AND ARTHRITIS OF RIGHT KNEE  PROCEDURE:  Procedure(s): KNEE ARTHROSCOPY WITH LATERAL MENISECTOMY; CHONDROPLASTY OF PATELLA AND TIBIAL PLATEAU (Right)   Findings:  1.  Mild chondromalacia medial femoral condyle and medial tibial plateau normal medial meniscus 2.  Normal ACL PCL 3.  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  Chondral lesion lateral femoral condyle grade 2 5.  Chondromalacia lateral tibial plateau grade 4 6.  Torn lateral meniscus posterior horn body. 7.  Synovitis moderate  The surgery was done as follows.  Mr. Emelia Loron was seen in the preop area he was confirmed with 2 identification markers which have been approved.  His right knee was confirmed as the surgical site.  It was marked.  Patient was taken to the surgical suite images were made available and reviewed.  He had general anesthesia with an LMA.  After successful anesthesia his right leg was prepped and draped sterilely he was placed arthroscopic leg holder.  Timeout was completed  I made a lateral portal placed the scope into the joint did a diagnostic arthroscopy I listed the findings above.  I made a medial portal with the assistance of a spinal needle.  While touring the knee medial compartment was easily accessible as was the patellofemoral joint and the notch area  Upon placing the patient in extreme figure-of-four position and internally rotating the tibia his lateral compartment stayed relatively closed and it was difficult to see into the lateral compartment  I placed the scope medially and looked laterally and it was still very difficult to see.  I made another portal medially and put the scope back into the joint laterally and by hyperflexing the knee past 110 degrees internally rotating the leg and lifting  up on the ankle I was able to finally get the instruments into the lateral compartment.  The posterior horn of the lateral meniscus was torn as was the body.  This was debrided with a shaver 90 and 50 degree ArthroCare wand.  We also used a power rasp on the tibial plateau to smooth it down.  I then did a chondroplasty of the patella with a straight shaver and a curved shaver  Synovectomy was done as needed to allow visualization  The knee was injected with 60 cc of Marcaine, however, most of that Marcaine was expressed and released from the joint upon suturing.  I decided to leave it out to allow less swelling of the joint sacrificing some of the pain control.  All 3 portals were closed with 3-0 nylon suture sterile dressing was applied with Cryo/Cuff Cryo/Cuff was activated patient extubated taken recovery room in stable condition  Postop prognosis guarded  SURGEON:  Surgeon(s) and Role:    * Vickki Hearing, MD - Primary  PHYSICIAN ASSISTANT:   ASSISTANTS: none   ANESTHESIA:   general  EBL:  5 mL   BLOOD ADMINISTERED:none  DRAINS: none   LOCAL MEDICATIONS USED:  MARCAINE     SPECIMEN:  No Specimen  DISPOSITION OF SPECIMEN:  N/A  COUNTS:  YES  TOURNIQUET:  * Missing tourniquet times found for documented tourniquets in log: [id number] *  DICTATION: .Dragon Dictation  PLAN OF CARE: Discharge to home after PACU  PATIENT DISPOSITION:  PACU - hemodynamically stable.   Delay start of Pharmacological VTE agent (>24hrs) due to surgical  blood loss or risk of bleeding: not applicable

## 2019-09-29 NOTE — Brief Op Note (Signed)
09/29/2019  10:59 AM  PATIENT:  Dan Castaneda  44 y.o. male  PRE-OPERATIVE DIAGNOSIS:  LATERAL MENISCOUS TEAR  POST-OPERATIVE DIAGNOSIS:  LATERAL MENISCOUS TEAR AND ARTHRITIS OF RIGHT KNEE  PROCEDURE:  Procedure(s): KNEE ARTHROSCOPY WITH LATERAL MENISECTOMY; CHONDROPLASTY OF PATELLA AND TIBIAL PLATEAU (Right)   Findings:  1.  Mild chondromalacia medial femoral condyle and medial tibial plateau normal medial meniscus 2.  Normal ACL PCL 3.  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  Chondral lesion lateral femoral condyle grade 2 5.  Chondromalacia lateral tibial plateau grade 4 6.  Torn lateral meniscus posterior horn body. 7.  Synovitis moderate  The surgery was done as follows.  Mr. Blakely was seen in the preop area he was confirmed with 2 identification markers which have been approved.  His right knee was confirmed as the surgical site.  It was marked.  Patient was taken to the surgical suite images were made available and reviewed.  He had general anesthesia with an LMA.  After successful anesthesia his right leg was prepped and draped sterilely he was placed arthroscopic leg holder.  Timeout was completed  I made a lateral portal placed the scope into the joint did a diagnostic arthroscopy I listed the findings above.  I made a medial portal with the assistance of a spinal needle.  While touring the knee medial compartment was easily accessible as was the patellofemoral joint and the notch area  Upon placing the patient in extreme figure-of-four position and internally rotating the tibia his lateral compartment stayed relatively closed and it was difficult to see into the lateral compartment  I placed the scope medially and looked laterally and it was still very difficult to see.  I made another portal medially and put the scope back into the joint laterally and by hyperflexing the knee past 110 degrees internally rotating the leg and lifting  up on the ankle I was able to finally get the instruments into the lateral compartment.  The posterior horn of the lateral meniscus was torn as was the body.  This was debrided with a shaver 90 and 50 degree ArthroCare wand.  We also used a power rasp on the tibial plateau to smooth it down.  I then did a chondroplasty of the patella with a straight shaver and a curved shaver  Synovectomy was done as needed to allow visualization  The knee was injected with 60 cc of Marcaine, however, most of that Marcaine was expressed and released from the joint upon suturing.  I decided to leave it out to allow less swelling of the joint sacrificing some of the pain control.  All 3 portals were closed with 3-0 nylon suture sterile dressing was applied with Cryo/Cuff Cryo/Cuff was activated patient extubated taken recovery room in stable condition  Postop prognosis guarded  SURGEON:  Surgeon(s) and Role:    * Tmya Wigington E, MD - Primary  PHYSICIAN ASSISTANT:   ASSISTANTS: none   ANESTHESIA:   general  EBL:  5 mL   BLOOD ADMINISTERED:none  DRAINS: none   LOCAL MEDICATIONS USED:  MARCAINE     SPECIMEN:  No Specimen  DISPOSITION OF SPECIMEN:  N/A  COUNTS:  YES  TOURNIQUET:  * Missing tourniquet times found for documented tourniquets in log: [id number] *  DICTATION: .Dragon Dictation  PLAN OF CARE: Discharge to home after PACU  PATIENT DISPOSITION:  PACU - hemodynamically stable.   Delay start of Pharmacological VTE agent (>24hrs) due to surgical   blood loss or risk of bleeding: not applicable

## 2019-09-29 NOTE — Anesthesia Preprocedure Evaluation (Signed)
Anesthesia Evaluation  Patient identified by MRN, date of birth, ID band Patient awake    Reviewed: Allergy & Precautions, H&P , NPO status , Patient's Chart, lab work & pertinent test results, reviewed documented beta blocker date and time   Airway Mallampati: I  TM Distance: >3 FB Neck ROM: full    Dental no notable dental hx. (+) Teeth Intact   Pulmonary neg pulmonary ROS, former smoker,    Pulmonary exam normal breath sounds clear to auscultation       Cardiovascular Exercise Tolerance: Good hypertension, negative cardio ROS   Rhythm:regular Rate:Normal     Neuro/Psych PSYCHIATRIC DISORDERS Anxiety negative neurological ROS     GI/Hepatic Neg liver ROS, GERD  Medicated,  Endo/Other  negative endocrine ROS  Renal/GU negative Renal ROS  negative genitourinary   Musculoskeletal   Abdominal   Peds  Hematology negative hematology ROS (+)   Anesthesia Other Findings   Reproductive/Obstetrics negative OB ROS                             Anesthesia Physical Anesthesia Plan  ASA: III  Anesthesia Plan: General   Post-op Pain Management:    Induction:   PONV Risk Score and Plan:   Airway Management Planned:   Additional Equipment:   Intra-op Plan:   Post-operative Plan:   Informed Consent: I have reviewed the patients History and Physical, chart, labs and discussed the procedure including the risks, benefits and alternatives for the proposed anesthesia with the patient or authorized representative who has indicated his/her understanding and acceptance.     Dental Advisory Given  Plan Discussed with: CRNA  Anesthesia Plan Comments:         Anesthesia Quick Evaluation

## 2019-09-29 NOTE — Discharge Instructions (Signed)
General Anesthesia, Adult, Care After This sheet gives you information about how to care for yourself after your procedure. Your health care provider may also give you more specific instructions. If you have problems or questions, contact your health care provider. What can I expect after the procedure? After the procedure, the following side effects are common:  Pain or discomfort at the IV site.  Nausea.  Vomiting.  Sore throat.  Trouble concentrating.  Feeling cold or chills.  Weak or tired.  Sleepiness and fatigue.  Soreness and body aches. These side effects can affect parts of the body that were not involved in surgery. Follow these instructions at home:  For at least 24 hours after the procedure:  Have a responsible adult stay with you. It is important to have someone help care for you until you are awake and alert.  Rest as needed.  Do not: ? Participate in activities in which you could fall or become injured. ? Drive. ? Use heavy machinery. ? Drink alcohol. ? Take sleeping pills or medicines that cause drowsiness. ? Make important decisions or sign legal documents. ? Take care of children on your own. Eating and drinking  Follow any instructions from your health care provider about eating or drinking restrictions.  When you feel hungry, start by eating small amounts of foods that are soft and easy to digest (bland), such as toast. Gradually return to your regular diet.  Drink enough fluid to keep your urine pale yellow.  If you vomit, rehydrate by drinking water, juice, or clear broth. General instructions  If you have sleep apnea, surgery and certain medicines can increase your risk for breathing problems. Follow instructions from your health care provider about wearing your sleep device: ? Anytime you are sleeping, including during daytime naps. ? While taking prescription pain medicines, sleeping medicines, or medicines that make you drowsy.  Return to  your normal activities as told by your health care provider. Ask your health care provider what activities are safe for you.  Take over-the-counter and prescription medicines only as told by your health care provider.  If you smoke, do not smoke without supervision.  Keep all follow-up visits as told by your health care provider. This is important. Contact a health care provider if:  You have nausea or vomiting that does not get better with medicine.  You cannot eat or drink without vomiting.  You have pain that does not get better with medicine.  You are unable to pass urine.  You develop a skin rash.  You have a fever.  You have redness around your IV site that gets worse. Get help right away if:  You have difficulty breathing.  You have chest pain.  You have blood in your urine or stool, or you vomit blood. Summary  After the procedure, it is common to have a sore throat or nausea. It is also common to feel tired.  Have a responsible adult stay with you for the first 24 hours after general anesthesia. It is important to have someone help care for you until you are awake and alert.  When you feel hungry, start by eating small amounts of foods that are soft and easy to digest (bland), such as toast. Gradually return to your regular diet.  Drink enough fluid to keep your urine pale yellow.  Return to your normal activities as told by your health care provider. Ask your health care provider what activities are safe for you. This information is not   intended to replace advice given to you by your health care provider. Make sure you discuss any questions you have with your health care provider. Document Revised: 03/06/2017 Document Reviewed: 10/17/2016 Elsevier Patient Education  2020 [employer]  

## 2019-09-29 NOTE — Interval H&P Note (Signed)
History and Physical Interval Note:  09/29/2019 9:01 AM  Dan Castaneda  has presented today for surgery, with the diagnosis of right knee arthroscopy lateral meniscectomy.  The various methods of treatment have been discussed with the patient and family. After consideration of risks, benefits and other options for treatment, the patient has consented to  Procedure(s): KNEE ARTHROSCOPY WITH LATERAL MENISECTOMY (Right) as a surgical intervention.  The patient's history has been reviewed, patient examined, no change in status, stable for surgery.  I have reviewed the patient's chart and labs.  Questions were answered to the patient's satisfaction.     Fuller Canada

## 2019-09-29 NOTE — Progress Notes (Signed)
Cell phone, black wallet, black sunglasses and orange blue box of contact lens to patient in bag.

## 2019-09-29 NOTE — Anesthesia Procedure Notes (Signed)
Procedure Name: LMA Insertion Date/Time: 09/29/2019 9:28 AM Performed by: Yolonda Kida, CRNA Pre-anesthesia Checklist: Patient identified, Emergency Drugs available, Suction available and Patient being monitored Patient Re-evaluated:Patient Re-evaluated prior to induction Oxygen Delivery Method: Circle system utilized Preoxygenation: Pre-oxygenation with 100% oxygen Induction Type: IV induction LMA: LMA inserted LMA Size: 5.0 Number of attempts: 1 Placement Confirmation: positive ETCO2 and breath sounds checked- equal and bilateral Tube secured with: Tape Dental Injury: Teeth and Oropharynx as per pre-operative assessment

## 2019-09-29 NOTE — Transfer of Care (Signed)
Immediate Anesthesia Transfer of Care Note  Patient: Dan Castaneda  Procedure(s) Performed: KNEE ARTHROSCOPY WITH LATERAL MENISECTOMY; CHONDROPLASTY OF PATELLA AND TIBIAL PLATEAU (Right Knee)  Patient Location: PACU  Anesthesia Type:General  Level of Consciousness: drowsy  Airway & Oxygen Therapy: Patient Spontanous Breathing and Patient connected to face mask oxygen  Post-op Assessment: Report given to RN and Post -op Vital signs reviewed and stable  Post vital signs: Reviewed and stable  Last Vitals:  Vitals Value Taken Time  BP 122/77 09/29/19 1107  Temp    Pulse 68 09/29/19 1113  Resp 20 09/29/19 1113  SpO2 100 % 09/29/19 1113  Vitals shown include unvalidated device data.  Last Pain:  Vitals:   09/29/19 0736  TempSrc: Oral  PainSc: 3          Complications: No complications documented.

## 2019-09-29 NOTE — Anesthesia Postprocedure Evaluation (Signed)
Anesthesia Post Note  Patient: Dan Castaneda  Procedure(s) Performed: KNEE ARTHROSCOPY WITH LATERAL MENISECTOMY; CHONDROPLASTY OF PATELLA AND TIBIAL PLATEAU (Right Knee)  Patient location during evaluation: PACU Anesthesia Type: General Level of consciousness: awake, awake and alert, oriented and patient cooperative Pain management: pain level controlled Vital Signs Assessment: post-procedure vital signs reviewed and stable Respiratory status: spontaneous breathing, respiratory function stable and nonlabored ventilation Cardiovascular status: blood pressure returned to baseline and stable Anesthetic complications: no   No complications documented.   Last Vitals:  Vitals:   09/29/19 1130 09/29/19 1145  BP: 117/78 101/72  Pulse: 77 76  Resp: (!) 21 16  Temp:    SpO2: 100% 96%    Last Pain:  Vitals:   09/29/19 1130  TempSrc:   PainSc: 0-No pain                 Eloisa Chokshi L

## 2019-09-30 ENCOUNTER — Encounter (HOSPITAL_COMMUNITY): Payer: Self-pay | Admitting: Orthopedic Surgery

## 2019-10-03 DIAGNOSIS — Z9889 Other specified postprocedural states: Secondary | ICD-10-CM | POA: Insufficient documentation

## 2019-10-06 ENCOUNTER — Ambulatory Visit (INDEPENDENT_AMBULATORY_CARE_PROVIDER_SITE_OTHER): Payer: Managed Care, Other (non HMO) | Admitting: Orthopedic Surgery

## 2019-10-06 ENCOUNTER — Other Ambulatory Visit: Payer: Self-pay

## 2019-10-06 DIAGNOSIS — Z9889 Other specified postprocedural states: Secondary | ICD-10-CM

## 2019-10-06 MED ORDER — HYDROCODONE-ACETAMINOPHEN 5-325 MG PO TABS: 1.0000 | ORAL_TABLET | Freq: Four times a day (QID) | ORAL | 0 refills | Status: AC | PRN

## 2019-10-06 NOTE — Patient Instructions (Signed)
Ice 4-6 x a day   exercises 2 x a day   Continue ibuprofen 3 x a day   Start hydrocodone for 5 days

## 2019-10-06 NOTE — Progress Notes (Signed)
POSTOP VISIT  POD # 7   There were no vitals taken for this visit.  Encounter Diagnosis  Name Primary?  . S/P right knee arthroscopy 09/29/19  Yes     Chief Complaint  Patient presents with  . Follow-up    Recheck on right knee, DOS 09-29-19.    HPI  44 year old male with arthroscopy of the lateral meniscectomy chondroplasty patella and right tibial plateau  Findings included mild chondromalacia medial femoral condyle medial tibial plateau with chondromalacia patella grade 2 femoral condyle lateral grade 2 tibial plateau lateral grade 4 torn lateral meniscus mild synovitis  Is doing well complains of swelling is walking with a cane  The lateral portal swollen more than the medial side in the joint has a small effusion  There are no signs of infection or DVT  Recommend home exercises ice 4 times a day switch pain medication follow-up in 3 weeks  Postoperative plan (Work, [employer],  Meds ordered this encounter  Medications  . HYDROcodone-acetaminophen (NORCO/VICODIN) 5-325 MG tablet    Sig: Take 1 tablet by mouth every 6 (six) hours as needed for up to 5 days for moderate pain or severe pain.    Dispense:  20 tablet    Refill:  0  ,FU)

## 2019-10-27 ENCOUNTER — Ambulatory Visit: Payer: Managed Care, Other (non HMO) | Admitting: Orthopedic Surgery

## 2019-11-04 ENCOUNTER — Ambulatory Visit: Payer: Managed Care, Other (non HMO) | Admitting: Orthopedic Surgery

## 2019-11-04 ENCOUNTER — Encounter: Payer: Self-pay | Admitting: Orthopedic Surgery

## 2021-05-15 ENCOUNTER — Emergency Department (HOSPITAL_COMMUNITY): Payer: 59

## 2021-05-15 ENCOUNTER — Other Ambulatory Visit: Payer: Self-pay

## 2021-05-15 ENCOUNTER — Emergency Department (HOSPITAL_COMMUNITY)
Admission: EM | Admit: 2021-05-15 | Discharge: 2021-05-15 | Disposition: A | Discharge status: Home / Self Care | Payer: 59 | Attending: Emergency Medicine | Admitting: Emergency Medicine

## 2021-05-15 ENCOUNTER — Encounter (HOSPITAL_COMMUNITY): Payer: Self-pay | Admitting: Radiology

## 2021-05-15 DIAGNOSIS — H53149 Visual discomfort, unspecified: Secondary | ICD-10-CM | POA: Diagnosis not present

## 2021-05-15 DIAGNOSIS — Z79899 Other long term (current) drug therapy: Secondary | ICD-10-CM | POA: Insufficient documentation

## 2021-05-15 DIAGNOSIS — R112 Nausea with vomiting, unspecified: Secondary | ICD-10-CM | POA: Insufficient documentation

## 2021-05-15 DIAGNOSIS — I10 Essential (primary) hypertension: Secondary | ICD-10-CM | POA: Diagnosis not present

## 2021-05-15 DIAGNOSIS — R42 Dizziness and giddiness: Secondary | ICD-10-CM | POA: Insufficient documentation

## 2021-05-15 DIAGNOSIS — R519 Headache, unspecified: Secondary | ICD-10-CM

## 2021-05-15 LAB — CBC WITH DIFFERENTIAL/PLATELET
Abs Immature Granulocytes: 0.34 10*3/uL — ABNORMAL HIGH (ref 0.00–0.07)
Basophils Absolute: 0.1 10*3/uL (ref 0.0–0.1)
Basophils Relative: 1 %
Eosinophils Absolute: 0.1 10*3/uL (ref 0.0–0.5)
Eosinophils Relative: 1 %
HCT: 53.3 % — ABNORMAL HIGH (ref 39.0–52.0)
Hemoglobin: 17 g/dL (ref 13.0–17.0)
Immature Granulocytes: 2 %
Lymphocytes Relative: 23 %
Lymphs Abs: 4.1 10*3/uL — ABNORMAL HIGH (ref 0.7–4.0)
MCH: 27.3 pg (ref 26.0–34.0)
MCHC: 31.9 g/dL (ref 30.0–36.0)
MCV: 85.6 fL (ref 80.0–100.0)
Monocytes Absolute: 1.2 10*3/uL — ABNORMAL HIGH (ref 0.1–1.0)
Monocytes Relative: 7 %
Neutro Abs: 11.9 10*3/uL — ABNORMAL HIGH (ref 1.7–7.7)
Neutrophils Relative %: 66 %
Platelets: 353 10*3/uL (ref 150–400)
RBC: 6.23 MIL/uL — ABNORMAL HIGH (ref 4.22–5.81)
RDW: 14 % (ref 11.5–15.5)
WBC: 17.6 10*3/uL — ABNORMAL HIGH (ref 4.0–10.5)
nRBC: 0 % (ref 0.0–0.2)

## 2021-05-15 LAB — BASIC METABOLIC PANEL
Anion gap: 10 (ref 5–15)
BUN: 21 mg/dL — ABNORMAL HIGH (ref 6–20)
CO2: 26 mmol/L (ref 22–32)
Calcium: 9.5 mg/dL (ref 8.9–10.3)
Chloride: 99 mmol/L (ref 98–111)
Creatinine, Ser: 0.89 mg/dL (ref 0.61–1.24)
GFR, Estimated: >60 mL/min (ref >60)
Glucose, Bld: 102 mg/dL — ABNORMAL HIGH (ref 70–99)
Potassium: 4 mmol/L (ref 3.5–5.1)
Sodium: 135 mmol/L (ref 135–145)

## 2021-05-15 MED ORDER — IOHEXOL 300 MG/ML  SOLN
100.0000 mL | Freq: Once | INTRAMUSCULAR | Status: AC | PRN
  Administered 2021-05-15: 75 mL via INTRAVENOUS

## 2021-05-15 MED ORDER — DIPHENHYDRAMINE HCL 50 MG/ML IJ SOLN
12.5000 mg | Freq: Once | INTRAMUSCULAR | Status: AC
Start: 2021-05-15 — End: 2021-05-15
  Administered 2021-05-15: 12.5 mg via INTRAVENOUS
  Filled 2021-05-15: qty 1

## 2021-05-15 MED ORDER — KETOROLAC TROMETHAMINE 30 MG/ML IJ SOLN
15.0000 mg | Freq: Once | INTRAMUSCULAR | Status: AC
  Administered 2021-05-15: 15 mg via INTRAVENOUS
  Filled 2021-05-15: qty 1

## 2021-05-15 MED ORDER — SODIUM CHLORIDE 0.9 % IV BOLUS
1000.0000 mL | Freq: Once | INTRAVENOUS | Status: AC
  Administered 2021-05-15: 1000 mL via INTRAVENOUS

## 2021-05-15 MED ORDER — METOCLOPRAMIDE HCL 5 MG/ML IJ SOLN
10.0000 mg | Freq: Once | INTRAMUSCULAR | Status: AC
Start: 2021-05-15 — End: 2021-05-15
  Administered 2021-05-15: 10 mg via INTRAVENOUS
  Filled 2021-05-15: qty 2

## 2021-05-15 NOTE — ED Triage Notes (Signed)
Headache x 5 days, was prescribed toradol and phenergan on Saturday without relief. ?

## 2021-05-15 NOTE — Discharge Instructions (Signed)
Likely you had a migraine, recommend over-the-counter pain medication as needed, please emergency hydrated, decrease in stress, get plenty of sleep this will help decrease migraines. ? ?Please follow-up with PCP and/or neurology for further evaluation. ? ?Come back to the emergency department if you develop chest pain, shortness of breath, severe abdominal pain, uncontrolled nausea, vomiting, diarrhea. ? ?

## 2021-05-15 NOTE — ED Notes (Signed)
Attempted IV x 2 with no success. 

## 2021-05-15 NOTE — ED Triage Notes (Signed)
Pt reports no hx of similar episodes. ?

## 2021-05-15 NOTE — ED Provider Notes (Signed)
[facility]ERGENCY DEPARTMENT Provider Note   CSN: [id number] Arrival date & time: 05/15/21  1613     History  Chief Complaint  Patient presents with   Migraine    Dan Castaneda is a 46 y.o. male.  HPI  Patient with medical history including hypertension, GERD, hypogonadism presents with complains of a headache.  Patient states headache started about 5 days ago, started after she got a steroid injection in his knees, states headache is mainly left side of his head, states is a pulsatile like sensation, constant, has associated photophobia nausea vomiting and dizziness, states it came on suddenly, has never had a headache like this past, no recent head trauma, not on anticoag's, said he has subjective fevers and chills states that he went to his primary care doctor who provided him with medication this has not decrease the pain.  He has no associate change in vision, paresthesia or weakness of her lower extremities.  Reviewed patient's chart had a telehealth visit with his PA who started him on Toradol and promethazine.  Home Medications Prior to Admission medications   Medication Sig Start Date End Date Taking? Authorizing Provider  ALPRAZolam Dan Feeler(XANAX) 1 MG tablet Take 1 mg by mouth at bedtime as needed. 04/25/21  Yes [provider]  Ascorbic Acid (VITAMIN C) 1000 MG tablet Take 1,000 mg by mouth daily.   Yes [provider]  atenolol (TENORMIN) 50 MG tablet Take 50 mg by mouth daily. 04/23/21  Yes [provider]  cyclobenzaprine (FLEXERIL) 10 MG tablet Take 10 mg by mouth 3 (three) times daily as needed for muscle spasms.  03/17/15  Yes [provider]  diclofenac (VOLTAREN) 75 MG EC tablet Take 75 mg by mouth 2 (two) times daily as needed. 04/23/21  Yes [provider]  DULoxetine (CYMBALTA) 60 MG capsule Take 60 mg by mouth at bedtime. 02/23/15  Yes [provider]  enalapril (VASOTEC) 2.5 MG tablet Take 2.5 mg by mouth daily.  04/29/19 05/15/21 Yes [provider]  glycopyrrolate (ROBINUL) 1 MG tablet Take 1 mg by mouth 2 (two) times daily. 05/02/21  Yes [provider]  ketorolac (TORADOL) 10 MG tablet Take 10 mg by mouth daily as needed for moderate pain. 05/13/21 05/18/21 Yes [provider]  lansoprazole (PREVACID) 30 MG capsule Take by mouth. 01/21/19  Yes [provider]  loratadine (CLARITIN) 10 MG tablet Take 10 mg by mouth at bedtime. 03/04/15  Yes [provider]  oxyCODONE-acetaminophen (PERCOCET) 7.5-325 MG tablet Take 1 tablet by mouth every 4 (four) hours as needed for severe pain. 09/29/19  Yes Dan HearingHarrison, Dan E, MD  promethazine (PHENERGAN) 25 MG tablet Take 25 mg by mouth every 8 (eight) hours as needed for nausea or vomiting. 05/13/21  Yes [provider]  tamsulosin (FLOMAX) 0.4 MG CAPS capsule Take 0.4 mg by mouth daily. 03/03/15  Yes [provider]  testosterone cypionate (DEPOTESTOTERONE CYPIONATE) 100 MG/ML injection Inject 100 mg into the muscle every 7 (seven) days. 04/20/21  Yes [provider]  ibuprofen (ADVIL) 800 MG tablet Take 1 tablet (800 mg total) by mouth every 8 (eight) hours as needed. Patient not taking: Reported on 05/15/2021 09/29/19   Dan HearingHarrison, Dan E, MD  ondansetron (ZOFRAN) 4 MG tablet Take 1 tablet (4 mg total) by mouth every 6 (six) hours. Patient not taking: Reported on 05/15/2021 10/30/15   Dan LefevreHaviland, Julie, MD      Allergies    Dan Castaneda    Review  of Systems   Review of Systems  Constitutional:  Negative for chills and fever.  Eyes:  Positive for photophobia.  Respiratory:  Negative for shortness of breath.   Cardiovascular:  Negative for chest pain.  Gastrointestinal:  Positive for nausea and vomiting. Negative for abdominal pain.  Neurological:  Positive for headaches.   Physical Exam Updated Vital Signs BP (!) 135/92    Pulse 74    Temp 98.2 F (36.8 C) (Oral)    Resp 18    Ht 6\' 1"  (1.854 m)    Wt (!)  142.9 kg    SpO2 98%    BMI 41.56 kg/m  Physical Exam Vitals and nursing note reviewed.  Constitutional:      General: He is not in acute distress.    Appearance: He is not ill-appearing.  HENT:     Head: Normocephalic and atraumatic.     Nose: No congestion.  Eyes:     Extraocular Movements: Extraocular movements intact.     Conjunctiva/sclera: Conjunctivae normal.     Pupils: Pupils are equal, round, and reactive to light.  Cardiovascular:     Rate and Rhythm: Normal rate and regular rhythm.     Pulses: Normal pulses.     Heart sounds: No murmur heard.   No friction rub. No gallop.  Pulmonary:     Effort: No respiratory distress.     Breath sounds: No wheezing, rhonchi or rales.  Musculoskeletal:     Cervical back: Normal range of motion. No rigidity.     Comments: Moving all 4 extremities neurovascular fully intact in upper lower extremities.  Skin:    General: Skin is warm and dry.  Neurological:     Mental Status: He is alert.     GCS: GCS eye subscore is 4. GCS verbal subscore is 5. GCS motor subscore is 6.     Cranial Nerves: Cranial nerves 2-12 are intact.     Sensory: Sensation is intact.     Motor: No weakness.     Coordination: Finger-Nose-Finger Test and Heel to Gerlach Test normal.     Comments: Cranial nerves II through XII grossly intact, no difficult word finding, a follow two-step commands, no unilateral weakness present.  Psychiatric:        Mood and Affect: Mood normal.    ED Results / Procedures / Treatments   Labs (all labs ordered are listed, but only abnormal results are displayed) Labs Reviewed  BASIC METABOLIC PANEL - Abnormal; Notable for the following components:      Result Value   Glucose, Bld 102 (*)    BUN 21 (*)    All other components within normal limits  CBC WITH DIFFERENTIAL/PLATELET - Abnormal; Notable for the following components:   WBC 17.6 (*)    RBC 6.23 (*)    HCT 53.3 (*)    Neutro Abs 11.9 (*)    Lymphs Abs 4.1 (*)     Monocytes Absolute 1.2 (*)    Abs Immature Granulocytes 0.34 (*)    All other components within normal limits    EKG None  Radiology CT VENOGRAM HEAD  Result Date: 05/15/2021 CLINICAL DATA:  Suspected dural venous sinus thrombosis EXAM: CT VENOGRAM HEAD TECHNIQUE: Venographic phase images of the brain were obtained following the administration of intravenous contrast. Multiplanar reformats and maximum intensity projections were generated. RADIATION DOSE REDUCTION: This exam was performed according to the departmental dose-optimization program which includes automated exposure control, adjustment of the mA and/or kV  according to patient size and/or use of iterative reconstruction technique. CONTRAST:  55mL OMNIPAQUE IOHEXOL 300 MG/ML  SOLN COMPARISON:  None. FINDINGS: CT BRAIN FINDINGS Brain: There is no mass, hemorrhage or extra-axial collection. The size and configuration of the ventricles and extra-axial CSF spaces are normal. The brain parenchyma is normal, without acute or chronic infarction. Vascular: No abnormal hyperdensity of the major intracranial arteries or dural venous sinuses. No intracranial atherosclerosis. Skull: The visualized skull base, calvarium and extracranial soft tissues are normal. Sinuses/Orbits: No fluid levels or advanced mucosal thickening of the visualized paranasal sinuses. No mastoid or middle ear effusion. The orbits are normal. CT VENOGRAM FINDINGS Superior sagittal sinus: Normal. Straight sinus: Normal. Inferior sagittal sinus, vein of Galen and internal cerebral veins: Normal. Transverse sinuses: Normal. Sigmoid sinuses: Normal. Visualized jugular veins: Normal. IMPRESSION: Normal CT venogram. Electronically Signed   By: Deatra Robinson M.D.   On: 05/15/2021 19:30    Procedures Procedures    Medications Ordered in ED Medications  metoCLOPramide (REGLAN) injection 10 mg (10 mg Intravenous Given 05/15/21 1726)  diphenhydrAMINE (BENADRYL) injection 12.5 mg (12.5 mg  Intravenous Given 05/15/21 1725)  ketorolac (TORADOL) 30 MG/ML injection 15 mg (15 mg Intravenous Given 05/15/21 1726)  sodium chloride 0.9 % bolus 1,000 mL (0 mLs Intravenous Stopped 05/15/21 1830)  iohexol (OMNIPAQUE) 300 MG/ML solution 100 mL (75 mLs Intravenous Contrast Given 05/15/21 1855)    ED Course/ Medical Decision Making/ A&P                           Medical Decision Making Amount and/or Complexity of Data Reviewed Labs: ordered. Radiology: ordered.  Risk Prescription drug management.   This patient presents to the ED for concern of headache, this involves an extensive number of treatment options, and is a complaint that carries with it a high risk of complications and morbidity.  The differential diagnosis includes intracranial head bleed, CVA, venous thrombosis, meningitis    Additional history obtained:  Additional history obtained from electronic medical record External records from outside source obtained and reviewed including please see HPI   Co morbidities that complicate the patient evaluation  Taking supplemental testosterone  Social Determinants of Health:  N/A    Lab Tests:  I Ordered, and personally interpreted labs.  The pertinent results include: CBC shows leukocytosis 17.6, BMP shows hyperglycemia 102 BUN of 21   Imaging Studies ordered:  I ordered imaging studies including CT head I independently visualized and interpreted imaging which showed negative for acute findings I agree with the radiologist interpretation   Cardiac Monitoring:  The patient was maintained on a cardiac monitor.  I personally viewed and interpreted the cardiac monitored which showed an underlying rhythm of: N/A   Medicines ordered and prescription drug management:  I ordered medication including migraine cocktail for headache I have reviewed the patients home medicines and have made adjustments as needed   Reevaluation:  Patient presents with symptoms  consistent with a migraine, will provide with a migraine cocktail and reassess, because this is his first headache will obtain CT head for rule out of mass/CVA  Patient is reassessed he is found resting comfortably has no complaints states migraine is completely resolved and he is agreeable for discharge.   Rule out low suspicion for internal head bleed and or mass as CT imaging is negative for acute findings.  Low suspicion for CVA she has no focal deficit present my exam.  Low suspicion for  dissection of the vertebral or carotid artery as presentation atypical of etiology.  Low suspicion for meningitis as she has no meningeal sign present.  He does have an elevated white count but I suspect this secondary due to recent steroid injection, he is afebrile, nontachycardic, no neck stiffness, very low suspicion for meningitis at this time.     Dispostion and problem list  After consideration of the diagnostic results and the patients response to treatment, I feel that the patent would benefit from discharge.  Headache since resolved-likely a migraine, will recommend over-the-counter pain medications, follow-up with neurology for further evaluation.  Given strict return precautions.            Final Clinical Impression(s) / ED Diagnoses Final diagnoses:  Bad headache    Rx / DC Orders ED Discharge Orders     None         Barnie Del 05/15/21 2009    Terrilee Files, MD 05/16/21 [phone]

## 2021-06-09 ENCOUNTER — Telehealth: Payer: 59

## 2021-11-25 IMAGING — MR MR KNEE*R* W/O CM
6 series · 37 of 40 positions shown · non-contrast
Comparison: Radiographs 08/09/2019

CLINICAL DATA: Three-week history of knee pain. No specific injury.
No prior surgery.

EXAM:
MRI OF THE RIGHT KNEE WITHOUT CONTRAST
TECHNIQUE: Multiplanar, multisequence MR imaging of the knee was performed. No
intravenous contrast was administered.

[Series 6: T2 fat-sat · axial · right · 4.0mm · 0.50mm/px · z∈[-32,+119]mm · 7 of 36 slices shown (1 of 3)]
[im 1/36]
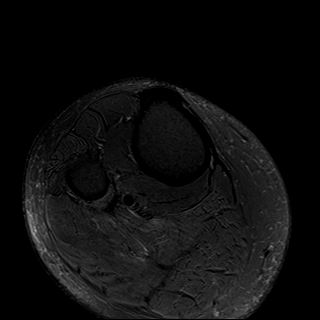
[im 6/36]
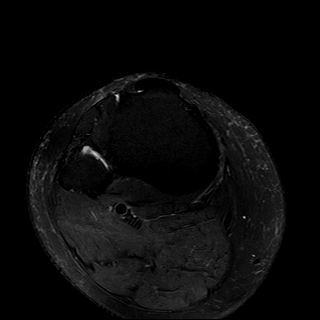
[im 12/36]
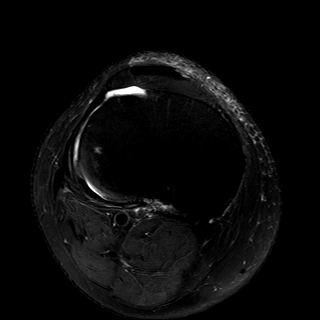
[im 18/36]
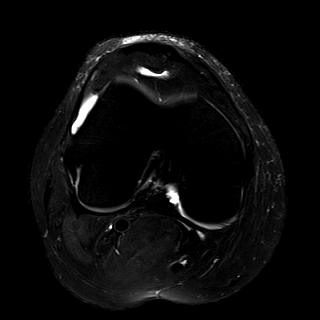
[im 24/36]
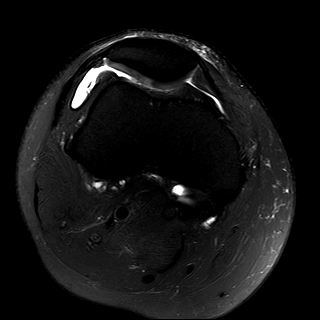
[im 30/36]
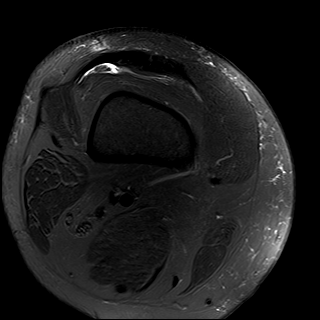
[im 36/36]
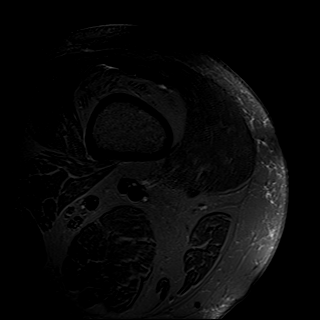

[Series 7: T2 fat-sat · coronal · right · 4.0mm · 0.39mm/px · 6 of 28 slices shown (2 of 3)]
[im 1/28]
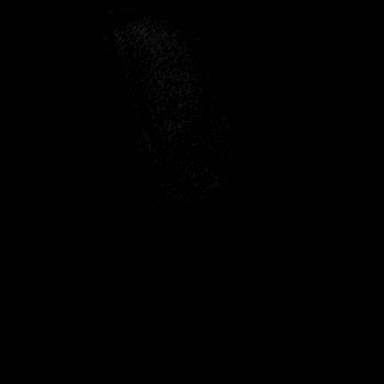
[im 6/28]
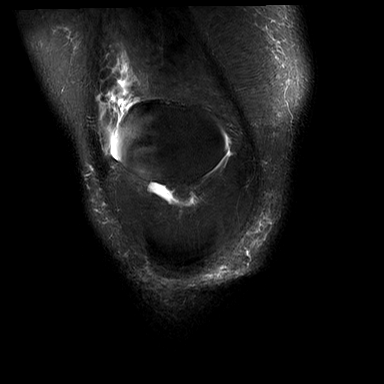
[im 11/28]
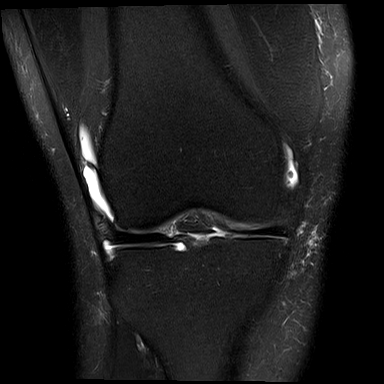
[im 17/28]
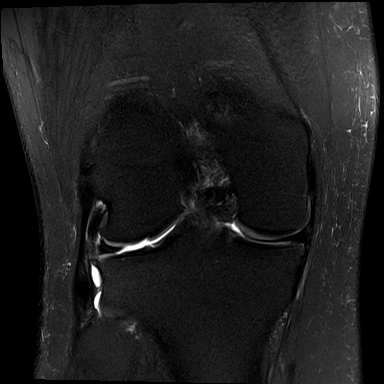
[im 22/28]
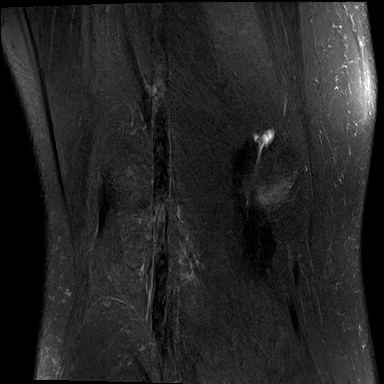
[im 28/28]
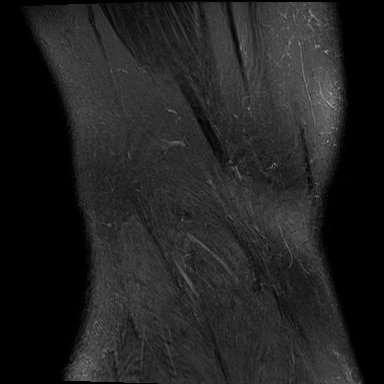

[Series 8: T1 · coronal · right · 4.0mm · 0.39mm/px · 3 of 28 slices shown]
[im 1/28]
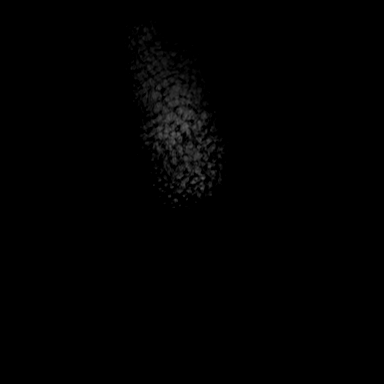
[im 6/28]
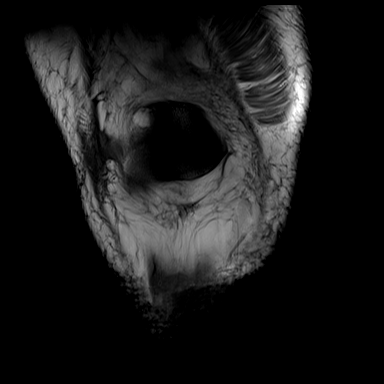
[im 11/28]
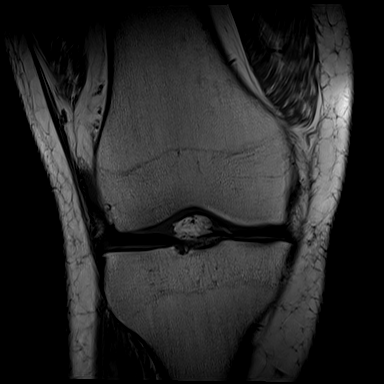

[Series 9: PD fat-sat · coronal · right · 3.0mm · 0.47mm/px · 7 of 32 slices shown (1 of 2)]
[im 1/32]
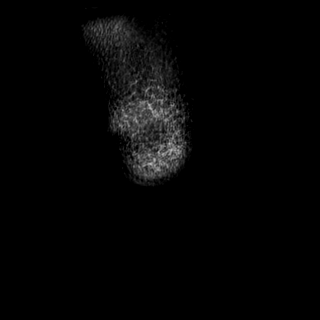
[im 6/32]
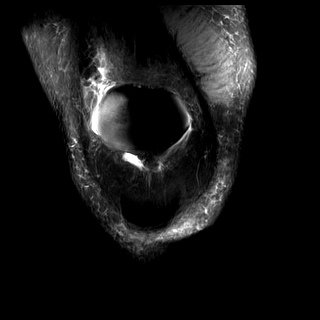
[im 11/32]
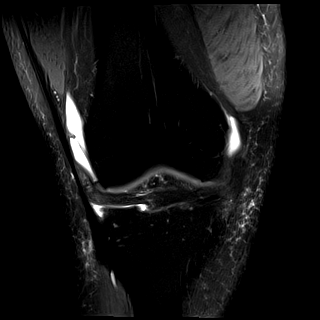
[im 16/32]
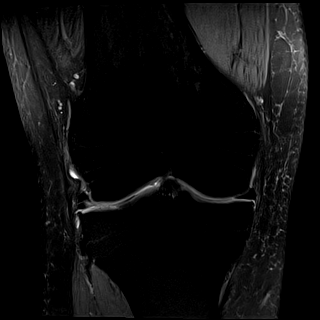
[im 21/32]
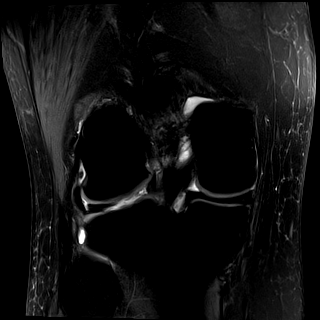
[im 26/32]
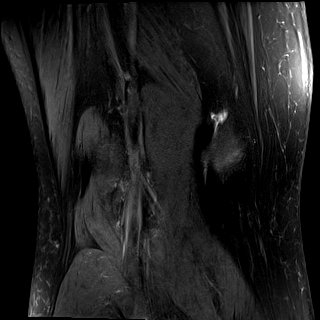
[im 32/32]
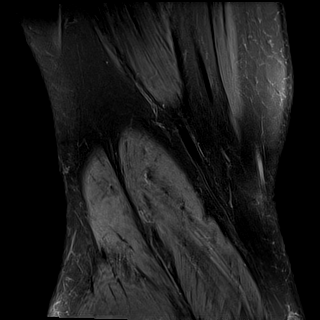

[Series 10: PD fat-sat · sagittal · right · 3.0mm · 0.39mm/px · 7 of 30 slices shown (2 of 2)]
[im 1/30]
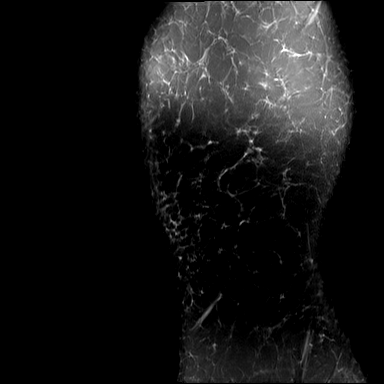
[im 5/30]
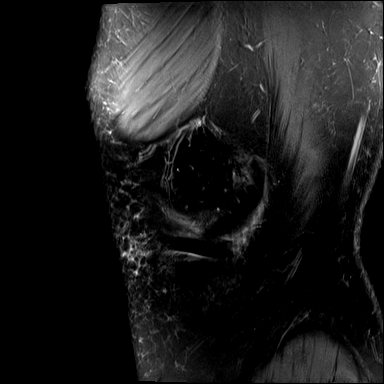
[im 10/30]
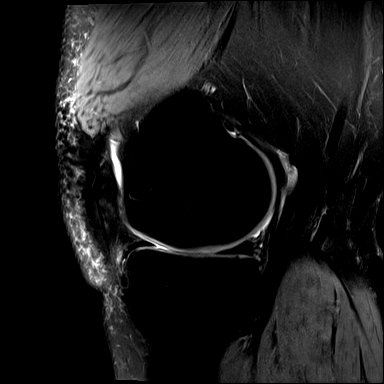
[im 15/30]
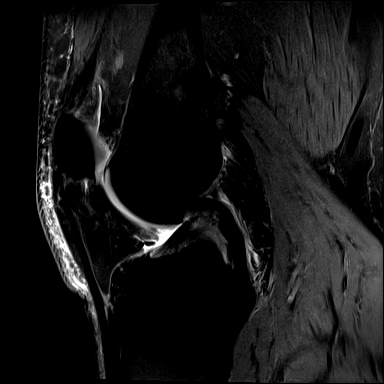
[im 20/30]
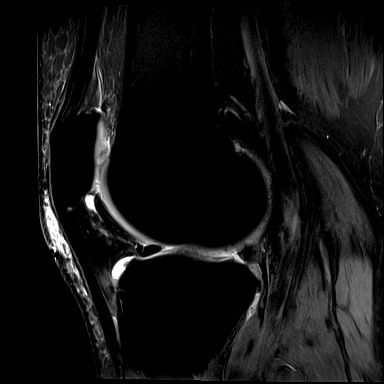
[im 25/30]
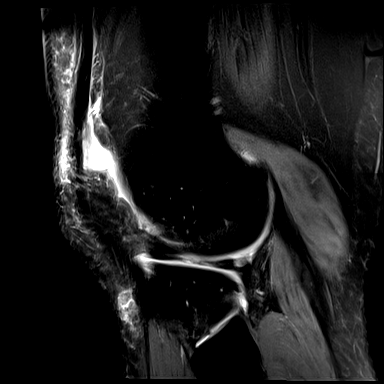
[im 30/30]
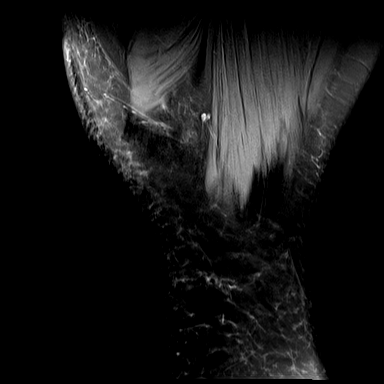

[Series 11: T2 fat-sat · sagittal · right · 3.0mm · 0.39mm/px · 7 of 30 slices shown (3 of 3)]
[im 1/30]
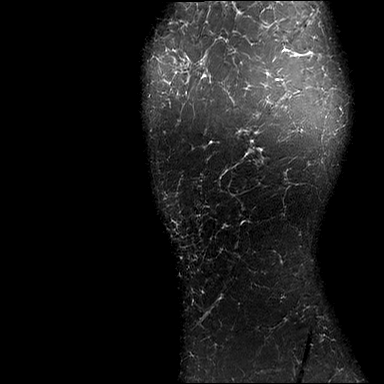
[im 5/30]
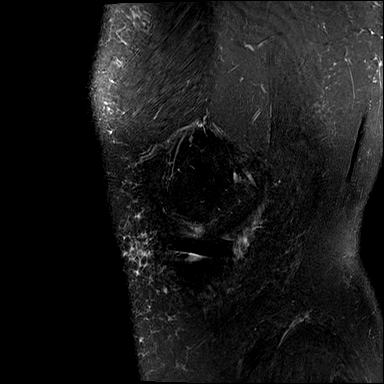
[im 10/30]
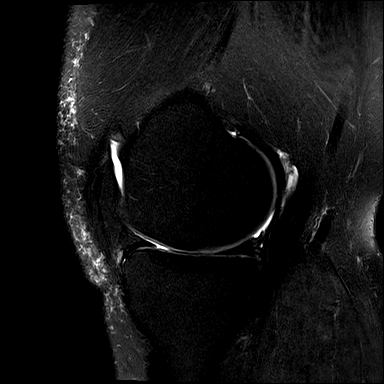
[im 15/30]
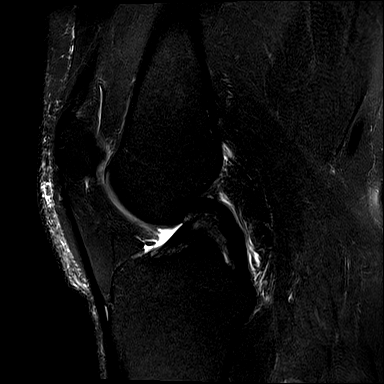
[im 20/30]
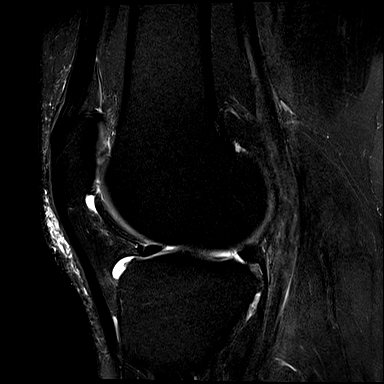
[im 25/30]
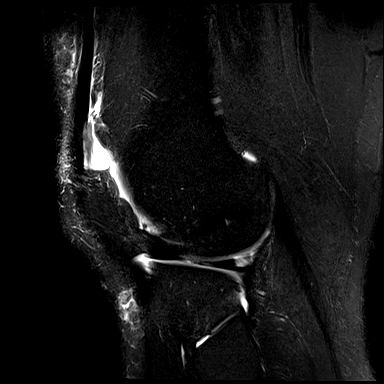
[im 30/30]
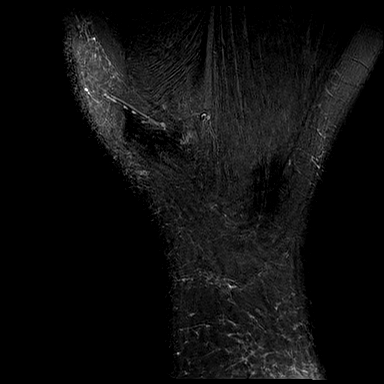

[37 of 40 positions shown; findings below may reference images not displayed]

FINDINGS: MENISCI

Medial meniscus: Intact. Mild intrasubstance degenerative changes
involving the posterior horn.

Lateral meniscus: Fairly extensive inferior articular surface and
free edge tear involving the posterior horn and mid body region.

LIGAMENTS

Cruciates:  Intact

Collaterals:  Intact

CARTILAGE

Patellofemoral: Moderate age advanced degenerative chondrosis.
Suspect a chondral flap type tear involving the lateral aspect of
the patellar apex. No full-thickness cartilage defect.

Medial:  Mild degenerative chondrosis.

Lateral: Moderate degenerative chondrosis. Focal cartilage tear
involving the lateral femoral condyle. This measures approximately 7
mm. Probable small flap component.

Joint:  Small joint effusion.

Popliteal Fossa:  No popliteal mass.  Small Baker's cyst.

Extensor Mechanism: The patella retinacular structures are intact
and the quadriceps and patellar tendons are intact.

Bones:  No acute bony findings.

Other: Unremarkable knee musculature.
IMPRESSION: 1. Fairly extensive inferior articular surface and free edge tear
involving the posterior horn and mid body region of the lateral
meniscus.
2. Intact ligamentous structures and no acute bony findings.
3. Tricompartmental degenerative chondrosis most significant in the
patellofemoral joint and lateral compartment. Chondral flap type
tear suspected.
4. Small joint effusion and small Baker's cyst.

## 2022-05-15 ENCOUNTER — Encounter: Payer: Self-pay | Admitting: Radiology

## 2022-05-27 ENCOUNTER — Telehealth: Payer: Self-pay | Admitting: Physician Assistant

## 2022-05-27 DIAGNOSIS — H6993 Unspecified Eustachian tube disorder, bilateral: Secondary | ICD-10-CM

## 2022-05-27 NOTE — Progress Notes (Signed)
E-Visit for Ear Pain - Eustachian Tube Dysfunction   We are sorry that you are not feeling well. Here is how we plan to help!  Based on what you have shared with me it looks like you have Eustachian Tube Dysfunction.  Eustachian Tube Dysfunction is a condition where the tubes that connect your middle ears to your upper throat become blocked. This can lead to discomfort, hearing difficulties and a feeling of fullness in your ear. Eustachian tube dysfunction usually resolves itself in a few days. The usual symptoms include: Hearing problems Tinnitus, or ringing in your ears Clicking or popping sounds A feeling of fullness in your ears Pain that mimics an ear infection Dizziness, vertigo or balance problems A "tickling" sensation in your ears  ?Eustachian tube dysfunction symptoms may get worse in higher altitudes. This is called barotrauma, and it can happen while scuba diving, flying in an airplane or driving in the mountains.   What causes eustachian tube dysfunction? Allergies and infections (like the common cold and the flu) are the most common causes of eustachian tube dysfunction. These conditions can cause inflammation and mucus buildup, leading to blockage. GERD, or chronic acid reflux, can also cause ETD. This is because stomach acid can back up into your throat and result in inflammation. As mentioned above, altitude changes can also cause ETD.   What are some common eustachian tube dysfunction treatments? In most cases, treatment isn't necessary because ETD often resolves on its own. However, you might need treatment if your symptoms linger for more than two weeks.    Eustachian tube dysfunction treatment depends on the cause and the severity of your condition. Treatments may include home remedies, medications or, in severe cases, surgery.     HOME CARE: Sometimes simple home remedies can help with mild cases of eustachian tube dysfunction. To try and clear the blockage, you  can: Chew gum. Yawn. Swallow. Try the Valsalva maneuver (breathing out forcefully while closing your mouth and pinching your nostrils). Use a saline spray to clear out nasal passages.  MEDICATIONS: Over-the-counter medications can help if allergies are causing eustachian tube dysfunction. Try antihistamines (like cetirizine or diphenhydramine) to ease your symptoms. If you have discomfort, pain relievers -- such as acetaminophen or ibuprofen -- can help.  Sometimes intranasal glucocorticosteroids (like Flonase or Nasacort) help.  I have prescribed Fluticasone 50 mcg/spray 2 sprays in each nostril daily for 10-14 days    GET HELP RIGHT AWAY IF: Fever is over 102.2 degrees. You develop progressive ear pain or hearing loss. Ear symptoms persist longer than 3 days after treatment.  MAKE SURE YOU: Understand these instructions. Will watch your condition. Will get help right away if you are not doing well or get worse.  Thank you for choosing an e-visit.  Your e-visit answers were reviewed by a board certified advanced clinical practitioner to complete your personal care plan. Depending upon the condition, your plan could have included both over the counter or prescription medications.  Please review your pharmacy choice. Make sure the pharmacy is open so you can pick up the prescription now. If there is a problem, you may contact your provider through MyChart messaging and have the prescription routed to another pharmacy.  Your safety is important to us. If you have drug allergies check your prescription carefully.   For the next 24 hours you can use MyChart to ask questions about today's visit, request a non-urgent call back, or ask for a work or school excuse. You will   get an email with a survey after your eVisit asking about your experience. We would appreciate your feedback. I hope that your e-visit has been valuable and will aid in your recovery.  I have spent 5 minutes in review of  e-visit questionnaire, review and updating patient chart, medical decision making and response to patient.   Nekeisha Aure M Chasidy Janak, PA-C  

## 2022-11-27 ENCOUNTER — Emergency Department (HOSPITAL_COMMUNITY)
Admission: EM | Admit: 2022-11-27 | Discharge: 2022-11-27 | Disposition: A | Discharge status: Home / Self Care | Payer: BC Managed Care – PPO | Attending: Emergency Medicine | Admitting: Emergency Medicine

## 2022-11-27 ENCOUNTER — Other Ambulatory Visit: Payer: Self-pay

## 2022-11-27 ENCOUNTER — Encounter (HOSPITAL_COMMUNITY): Payer: Self-pay

## 2022-11-27 DIAGNOSIS — R110 Nausea: Secondary | ICD-10-CM | POA: Insufficient documentation

## 2022-11-27 DIAGNOSIS — Z79899 Other long term (current) drug therapy: Secondary | ICD-10-CM | POA: Diagnosis not present

## 2022-11-27 DIAGNOSIS — R040 Epistaxis: Secondary | ICD-10-CM | POA: Insufficient documentation

## 2022-11-27 LAB — COMPREHENSIVE METABOLIC PANEL
ALT: 33 U/L (ref 0–44)
AST: 22 U/L (ref 15–41)
Albumin: 4 g/dL (ref 3.5–5.0)
Alkaline Phosphatase: 46 U/L (ref 38–126)
Anion gap: 10 (ref 5–15)
BUN: 15 mg/dL (ref 6–20)
CO2: 27 mmol/L (ref 22–32)
Calcium: 9.2 mg/dL (ref 8.9–10.3)
Chloride: 99 mmol/L (ref 98–111)
Creatinine, Ser: 0.86 mg/dL (ref 0.61–1.24)
GFR, Estimated: >60 mL/min (ref >60)
Glucose, Bld: 103 mg/dL — ABNORMAL HIGH (ref 70–99)
Potassium: 4.2 mmol/L (ref 3.5–5.1)
Sodium: 136 mmol/L (ref 135–145)
Total Bilirubin: 0.7 mg/dL (ref 0.3–1.2)
Total Protein: 7.4 g/dL (ref 6.5–8.1)

## 2022-11-27 LAB — CBC WITH DIFFERENTIAL/PLATELET
Abs Immature Granulocytes: 0.06 10*3/uL (ref 0.00–0.07)
Basophils Absolute: 0.1 10*3/uL (ref 0.0–0.1)
Basophils Relative: 1 %
Eosinophils Absolute: 0.2 10*3/uL (ref 0.0–0.5)
Eosinophils Relative: 2 %
HCT: 45.2 % (ref 39.0–52.0)
Hemoglobin: 14.3 g/dL (ref 13.0–17.0)
Immature Granulocytes: 1 %
Lymphocytes Relative: 25 %
Lymphs Abs: 1.8 10*3/uL (ref 0.7–4.0)
MCH: 27.3 pg (ref 26.0–34.0)
MCHC: 31.6 g/dL (ref 30.0–36.0)
MCV: 86.4 fL (ref 80.0–100.0)
Monocytes Absolute: 0.4 10*3/uL (ref 0.1–1.0)
Monocytes Relative: 6 %
Neutro Abs: 5 10*3/uL (ref 1.7–7.7)
Neutrophils Relative %: 65 %
Platelets: 249 10*3/uL (ref 150–400)
RBC: 5.23 MIL/uL (ref 4.22–5.81)
RDW: 14.1 % (ref 11.5–15.5)
WBC: 7.5 10*3/uL (ref 4.0–10.5)
nRBC: 0 % (ref 0.0–0.2)

## 2022-11-27 MED ORDER — OXYMETAZOLINE HCL 0.05 % NA SOLN
1.0000 | Freq: Once | NASAL | Status: AC
  Administered 2022-11-27: 1 via NASAL
  Filled 2022-11-27: qty 30

## 2022-11-27 MED ORDER — ONDANSETRON 4 MG PO TBDP
4.0000 mg | ORAL_TABLET | Freq: Once | ORAL | Status: AC
  Administered 2022-11-27: 4 mg via ORAL
  Filled 2022-11-27: qty 1

## 2022-11-27 NOTE — Discharge Instructions (Addendum)
Pleasure taking care of you today.  You were seen for a nosebleed.  It stopped with the Afrin nasal spray.  If you happen to have bleeding again you can use 1 to 2 sprays on each side and apply pressure to your nose steadily for 15 minutes.  If you have continued bleeding despite this come to the ER.  Your blood work was reassuring.  Please use saline nasal spray several times a day to help moisturize your nasal passages.  You can use Vaseline or Aquaphor inside your nose at nighttime to protect it from the drying effects from your CPAP machine.  Follow-up with your primary care doctor for a recheck.  If you have any new or worsening symptoms come back to the ER.  You to follow-up with ENT if needed

## 2022-11-27 NOTE — ED Provider Notes (Signed)
[place] EMERGENCY DEPARTMENT AT [facility] Provider Note   CSN: [id number] Arrival date & time: 11/27/22  1305     History  Chief Complaint  Patient presents with   Epistaxis    Dan Castaneda is a 47 y.o. male.  [place]ent[place] the ER for nosebleed out of bilateral naris crusted red around 1 AM, lasted for about 45 minutes and stopped straight again today when he touched his nose.  Has been continuous, having some nausea from the blood going down his throat into his stomach.  No vomiting.  No blood thinner use, no other bleeding or bruising.  No history of coagulopathy.  He does wear his CPAP at night   Epistaxis      Home Medications Prior to Admission medications   Medication Sig Start Date End Date Taking? Authorizing Provider  ALPRAZolam Prudy Feeler) 1 MG tablet Take 1 mg by mouth at bedtime as needed. 04/25/21   [provider]  Ascorbic Acid (VITAMIN C) 1000 MG tablet Take 1,000 mg by mouth daily.    [provider]  atenolol (TENORMIN) 50 MG tablet Take 50 mg by mouth daily. 04/23/21   [provider]  cyclobenzaprine (FLEXERIL) 10 MG tablet Take 10 mg by mouth 3 (three) times daily as needed for muscle spasms.  03/17/15   [provider]  diclofenac (VOLTAREN) 75 MG EC tablet Take 75 mg by mouth 2 (two) times daily as needed. 04/23/21   [provider]  DULoxetine (CYMBALTA) 60 MG capsule Take 60 mg by mouth at bedtime. 02/23/15   [provider]  enalapril (VASOTEC) 2.5 MG tablet Take 2.5 mg by mouth daily. 04/29/19 05/15/21  [provider]  glycopyrrolate (ROBINUL) 1 MG tablet Take 1 mg by mouth 2 (two) times daily. 05/02/21   [provider]  ibuprofen (ADVIL) 800 MG tablet Take 1 tablet (800 mg total) by mouth every 8 (eight) hours as needed. Patient not taking: Reported on 05/15/2021 09/29/19   Vickki Hearing, MD  lansoprazole (PREVACID) 30 MG capsule Take by mouth. 01/21/19   [provider]   loratadine (CLARITIN) 10 MG tablet Take 10 mg by mouth at bedtime. 03/04/15   [provider]  ondansetron (ZOFRAN) 4 MG tablet Take 1 tablet (4 mg total) by mouth every 6 (six) hours. Patient not taking: Reported on 05/15/2021 10/30/15   Jacalyn Lefevre, MD  oxyCODONE-acetaminophen (PERCOCET) 7.5-325 MG tablet Take 1 tablet by mouth every 4 (four) hours as needed for severe pain. 09/29/19   Vickki Hearing, MD  promethazine (PHENERGAN) 25 MG tablet Take 25 mg by mouth every 8 (eight) hours as needed for nausea or vomiting. 05/13/21   [provider]  tamsulosin (FLOMAX) 0.4 MG CAPS capsule Take 0.4 mg by mouth daily. 03/03/15   [provider]  testosterone cypionate (DEPOTESTOTERONE CYPIONATE) 100 MG/ML injection Inject 100 mg into the muscle every 7 (seven) days. 04/20/21   [provider]      Allergies    Codeine    Review of Systems   Review of Systems  HENT:  Positive for nosebleeds.     Physical Exam Updated Vital Signs BP 110/69   Pulse 68   Temp 97.7 F (36.5 C) (Oral)   Resp 16   Ht 6\' 1"  (1.854 m)   Wt 118.8 kg   SpO2 97%   BMI 34.57 kg/m  Physical Exam Vitals and nursing note reviewed.  Constitutional:      General: He  is not in acute distress.    Appearance: He is well-developed.  HENT:     Head: Normocephalic and atraumatic.     Nose:     Comments: Mild injection to nasal septum bilaterally.  No septal hematoma, no active bleeding, no polyps or masses noted    Mouth/Throat:     Mouth: Mucous membranes are moist.  Eyes:     Conjunctiva/sclera: Conjunctivae normal.  Cardiovascular:     Rate and Rhythm: Normal rate and regular rhythm.     Heart sounds: No murmur heard. Pulmonary:     Effort: Pulmonary effort is normal. No respiratory distress.     Breath sounds: Normal breath sounds.  Abdominal:     Palpations: Abdomen is soft.     Tenderness: There is no abdominal tenderness.  Musculoskeletal:        General: No  swelling.     Cervical back: Neck supple.  Skin:    General: Skin is warm and dry.     Capillary Refill: Capillary refill takes less than 2 seconds.  Neurological:     General: No focal deficit present.     Mental Status: He is alert and oriented to person, place, and time.  Psychiatric:        Mood and Affect: Mood normal.     ED Results / Procedures / Treatments   Labs (all labs ordered are listed, but only abnormal results are displayed) Labs Reviewed  COMPREHENSIVE METABOLIC PANEL - Abnormal; Notable for the following components:      Result Value   Glucose, Bld 103 (*)    All other components within normal limits  CBC WITH DIFFERENTIAL/PLATELET    EKG None  Radiology No results found.  Procedures Procedures    Medications Ordered in ED Medications  ondansetron (ZOFRAN-ODT) disintegrating tablet 4 mg (has no administration in time range)  oxymetazoline (AFRIN) 0.05 % nasal spray 1 spray (1 spray Each Nare Given 11/27/22 1334)    ED Course/ Medical Decision Making/ A&P                                 Medical Decision Making DDX: Epistaxis, coagulopathy, other ED course: Patient presented with nosebleed not able to be stopped at home.  Received Afrin in the ED and has since resolved.  Basic labs ordered due to prolonged bleeding but he is not anemic, normal platelets, CMP is reassuring.  He takes baby aspirin daily but no other blood thinners.  He does wear a CPAP which we discussed could be part of why his nasal mucosa is dry and prone to cracking and bleeding.  Discussed using nasal saline several times a day for moisturization and he can use Vaseline or Aquaphor on the inside of his nose at nighttime to prevent drying.  Advised on PCP follow-up for recheck and ENT if he continues to have any issues.  Instructed on use of Afrin at home if bleeding starts again and given return precautions.   Amount and/or Complexity of Data Reviewed External Data Reviewed: labs  and notes. Labs: ordered. Decision-making details documented in ED Course.  Risk OTC drugs.           Final Clinical Impression(s) / ED Diagnoses Final diagnoses:  Epistaxis    Rx / DC Orders ED Discharge Orders     None         Ma Rings, PA-C 11/27/22 1454  Terrilee Files, MD 11/27/22 [phone]

## 2022-11-27 NOTE — ED Notes (Signed)
Informed MD Verbal for nasal spray

## 2022-11-27 NOTE — ED Triage Notes (Signed)
Nose bleed started at 12:15 approx 1 hour ago after pt touched nose  1am had nose bleed that lasted approx 30 mins caused by picking his nose.   Bleeding uncontrolled from both nostrils, not on blood thinners.

## 2022-11-27 NOTE — ED Notes (Signed)
Medicated per MAR Pt holding pressure Still bleeding

## 2022-11-28 ENCOUNTER — Emergency Department (HOSPITAL_COMMUNITY)
Admission: EM | Admit: 2022-11-28 | Discharge: 2022-11-29 | Disposition: A | Discharge status: Home / Self Care | Payer: BC Managed Care – PPO | Attending: Emergency Medicine | Admitting: Emergency Medicine

## 2022-11-28 ENCOUNTER — Other Ambulatory Visit: Payer: Self-pay

## 2022-11-28 ENCOUNTER — Encounter (HOSPITAL_COMMUNITY): Payer: Self-pay

## 2022-11-28 DIAGNOSIS — J3489 Other specified disorders of nose and nasal sinuses: Secondary | ICD-10-CM | POA: Diagnosis not present

## 2022-11-28 DIAGNOSIS — R040 Epistaxis: Secondary | ICD-10-CM | POA: Insufficient documentation

## 2022-11-28 DIAGNOSIS — Z8249 Family history of ischemic heart disease and other diseases of the circulatory system: Secondary | ICD-10-CM | POA: Insufficient documentation

## 2022-11-28 DIAGNOSIS — I10 Essential (primary) hypertension: Secondary | ICD-10-CM | POA: Insufficient documentation

## 2022-11-28 MED ORDER — TRANEXAMIC ACID FOR EPISTAXIS
500.0000 mg | Freq: Once | TOPICAL | Status: AC
  Administered 2022-11-28: 500 mg via TOPICAL
  Filled 2022-11-28: qty 10

## 2022-11-28 NOTE — ED Provider Notes (Signed)
[place] EMERGENCY DEPARTMENT AT [facility] Provider Note   CSN: [id number] Arrival date & time: 11/28/22  2202     History {Add pertinent medical, surgical, social history, OB history to HPI:1} Chief Complaint  Patient presents with   Epistaxis    Dan Castaneda is a 47 y.o. male.  To the emergency department for evaluation of nosebleed.  Patient reports that he was driving when that nosebleed started approximately 2 hours before coming to the ED.  This is a recurrent nosebleed, previously stopped with Afrin.  It seems to be heavier from the right side but blood has been going down his throat and coming out of both sides of the nose.  He is not on blood thinners.       Home Medications Prior to Admission medications   Medication Sig Start Date End Date Taking? Authorizing Provider  ALPRAZolam Prudy Feeler) 1 MG tablet Take 1 mg by mouth at bedtime as needed. 04/25/21   [provider]  Ascorbic Acid (VITAMIN C) 1000 MG tablet Take 1,000 mg by mouth daily.    [provider]  atenolol (TENORMIN) 50 MG tablet Take 50 mg by mouth daily. 04/23/21   [provider]  cyclobenzaprine (FLEXERIL) 10 MG tablet Take 10 mg by mouth 3 (three) times daily as needed for muscle spasms.  03/17/15   [provider]  diclofenac (VOLTAREN) 75 MG EC tablet Take 75 mg by mouth 2 (two) times daily as needed. 04/23/21   [provider]  DULoxetine (CYMBALTA) 60 MG capsule Take 60 mg by mouth at bedtime. 02/23/15   [provider]  enalapril (VASOTEC) 2.5 MG tablet Take 2.5 mg by mouth daily. 04/29/19 05/15/21  [provider]  glycopyrrolate (ROBINUL) 1 MG tablet Take 1 mg by mouth 2 (two) times daily. 05/02/21   [provider]  ibuprofen (ADVIL) 800 MG tablet Take 1 tablet (800 mg total) by mouth every 8 (eight) hours as needed. Patient not taking: Reported on 05/15/2021 09/29/19   Vickki Hearing, MD  lansoprazole (PREVACID) 30 MG  capsule Take by mouth. 01/21/19   [provider]  loratadine (CLARITIN) 10 MG tablet Take 10 mg by mouth at bedtime. 03/04/15   [provider]  ondansetron (ZOFRAN) 4 MG tablet Take 1 tablet (4 mg total) by mouth every 6 (six) hours. Patient not taking: Reported on 05/15/2021 10/30/15   Jacalyn Lefevre, MD  oxyCODONE-acetaminophen (PERCOCET) 7.5-325 MG tablet Take 1 tablet by mouth every 4 (four) hours as needed for severe pain. 09/29/19   Vickki Hearing, MD  promethazine (PHENERGAN) 25 MG tablet Take 25 mg by mouth every 8 (eight) hours as needed for nausea or vomiting. 05/13/21   [provider]  tamsulosin (FLOMAX) 0.4 MG CAPS capsule Take 0.4 mg by mouth daily. 03/03/15   [provider]  testosterone cypionate (DEPOTESTOTERONE CYPIONATE) 100 MG/ML injection Inject 100 mg into the muscle every 7 (seven) days. 04/20/21   [provider]      Allergies    Codeine and Semaglutide    Review of Systems   Review of Systems  Physical Exam Updated Vital Signs BP (!) 126/99   Pulse 98   Temp 98 F (36.7 C)   Resp 16   Wt 118 kg   SpO2 98%   BMI 34.32 kg/m  Physical Exam Vitals and nursing note reviewed.  Constitutional:      General: He is not in acute distress.  Appearance: He is well-developed.  HENT:     Head: Normocephalic and atraumatic.     Nose:     Right Nostril: Epistaxis present.     Mouth/Throat:     Mouth: Mucous membranes are moist.  Eyes:     General: Vision grossly intact. Gaze aligned appropriately.     Extraocular Movements: Extraocular movements intact.     Conjunctiva/sclera: Conjunctivae normal.  Cardiovascular:     Rate and Rhythm: Normal rate and regular rhythm.     Pulses: Normal pulses.     Heart sounds: Normal heart sounds, S1 normal and S2 normal. No murmur heard.    No friction rub. No gallop.  Pulmonary:     Effort: Pulmonary effort is normal. No respiratory distress.     Breath sounds: Normal breath  sounds.  Abdominal:     Palpations: Abdomen is soft.     Tenderness: There is no abdominal tenderness. There is no guarding or rebound.     Hernia: No hernia is present.  Musculoskeletal:        General: No swelling.     Cervical back: Full passive range of motion without pain, normal range of motion and neck supple. No pain with movement, spinous process tenderness or muscular tenderness. Normal range of motion.     Right lower leg: No edema.     Left lower leg: No edema.  Skin:    General: Skin is warm and dry.     Capillary Refill: Capillary refill takes less than 2 seconds.     Findings: No ecchymosis, erythema, lesion or wound.  Neurological:     Mental Status: He is alert and oriented to person, place, and time.     GCS: GCS eye subscore is 4. GCS verbal subscore is 5. GCS motor subscore is 6.     Cranial Nerves: Cranial nerves 2-12 are intact.     Sensory: Sensation is intact.     Motor: Motor function is intact. No weakness or abnormal muscle tone.     Coordination: Coordination is intact.  Psychiatric:        Mood and Affect: Mood normal.        Speech: Speech normal.        Behavior: Behavior normal.     ED Results / Procedures / Treatments   Labs (all labs ordered are listed, but only abnormal results are displayed) Labs Reviewed - No data to display  EKG None  Radiology No results found.  Procedures Procedures  {Document cardiac monitor, telemetry assessment procedure when appropriate:1}  Medications Ordered in ED Medications  tranexamic acid (CYKLOKAPRON) 1000 MG/10ML topical solution 500 mg (500 mg Topical Given by Other 11/28/22 2341)    ED Course/ Medical Decision Making/ A&P   {   Click here for ABCD2, HEART and other calculatorsREFRESH Note before signing :1}                              Medical Decision Making  ***  {Document critical care time when appropriate:1} {Document review of labs and clinical decision tools ie heart score,  Chads2Vasc2 etc:1}  {Document your independent review of radiology images, and any outside records:1} {Document your discussion with family members, caretakers, and with consultants:1} {Document social determinants of health affecting pt's care:1} {Document your decision making why or why not admission, treatments were needed:1} Final Clinical Impression(s) / ED Diagnoses Final diagnoses:  None    Rx /  DC Orders ED Discharge Orders     None

## 2022-11-28 NOTE — ED Triage Notes (Signed)
Nose bleed x2 hours to bilateral nares.  Pt reports being seen for same yesterday and prescribed afrin. Pt reports no relief with afrin PTA.  Bleeding noted in triage Denies blood thinner usage.  Ambulatory to triage

## 2022-11-29 ENCOUNTER — Encounter (HOSPITAL_COMMUNITY): Payer: Self-pay

## 2022-11-29 ENCOUNTER — Emergency Department (HOSPITAL_COMMUNITY)
Admission: EM | Admit: 2022-11-29 | Discharge: 2022-11-29 | Disposition: A | Discharge status: Home / Self Care | Payer: BC Managed Care – PPO | Attending: Emergency Medicine | Admitting: Emergency Medicine

## 2022-11-29 DIAGNOSIS — I10 Essential (primary) hypertension: Secondary | ICD-10-CM | POA: Insufficient documentation

## 2022-11-29 DIAGNOSIS — R040 Epistaxis: Secondary | ICD-10-CM | POA: Insufficient documentation

## 2022-11-29 DIAGNOSIS — J3489 Other specified disorders of nose and nasal sinuses: Secondary | ICD-10-CM | POA: Insufficient documentation

## 2022-11-29 LAB — CBC
HCT: 42.8 % (ref 39.0–52.0)
Hemoglobin: 13.7 g/dL (ref 13.0–17.0)
MCH: 28 pg (ref 26.0–34.0)
MCHC: 32 g/dL (ref 30.0–36.0)
MCV: 87.3 fL (ref 80.0–100.0)
Platelets: 313 10*3/uL (ref 150–400)
RBC: 4.9 MIL/uL (ref 4.22–5.81)
RDW: 13.9 % (ref 11.5–15.5)
WBC: 17.7 10*3/uL — ABNORMAL HIGH (ref 4.0–10.5)
nRBC: 0 % (ref 0.0–0.2)

## 2022-11-29 LAB — PROTIME-INR
INR: 1.1 (ref 0.8–1.2)
Prothrombin Time: 13.9 s (ref 11.4–15.2)

## 2022-11-29 LAB — COMPREHENSIVE METABOLIC PANEL
ALT: 32 U/L (ref 0–44)
AST: 24 U/L (ref 15–41)
Albumin: 3.9 g/dL (ref 3.5–5.0)
Alkaline Phosphatase: 43 U/L (ref 38–126)
Anion gap: 10 (ref 5–15)
BUN: 25 mg/dL — ABNORMAL HIGH (ref 6–20)
CO2: 23 mmol/L (ref 22–32)
Calcium: 8.8 mg/dL — ABNORMAL LOW (ref 8.9–10.3)
Chloride: 102 mmol/L (ref 98–111)
Creatinine, Ser: 0.99 mg/dL (ref 0.61–1.24)
GFR, Estimated: >60 mL/min (ref >60)
Glucose, Bld: 149 mg/dL — ABNORMAL HIGH (ref 70–99)
Potassium: 4.1 mmol/L (ref 3.5–5.1)
Sodium: 135 mmol/L (ref 135–145)
Total Bilirubin: 2.1 mg/dL — ABNORMAL HIGH (ref 0.3–1.2)
Total Protein: 6.8 g/dL (ref 6.5–8.1)

## 2022-11-29 LAB — TYPE AND SCREEN
ABO/RH(D): A POS
Antibody Screen: POSITIVE

## 2022-11-29 MED ORDER — LACTATED RINGERS IV BOLUS
1000.0000 mL | Freq: Once | INTRAVENOUS | Status: AC
  Administered 2022-11-29: 1000 mL via INTRAVENOUS

## 2022-11-29 MED ORDER — OXYMETAZOLINE HCL 0.05 % NA SOLN
1.0000 | Freq: Once | NASAL | Status: AC
  Administered 2022-11-29: 1 via NASAL
  Filled 2022-11-29: qty 30

## 2022-11-29 MED ORDER — ONDANSETRON 8 MG PO TBDP
8.0000 mg | ORAL_TABLET | Freq: Once | ORAL | Status: AC
  Administered 2022-11-29: 8 mg via ORAL
  Filled 2022-11-29: qty 1

## 2022-11-29 MED ORDER — METOCLOPRAMIDE HCL 5 MG/ML IJ SOLN
10.0000 mg | Freq: Once | INTRAMUSCULAR | Status: AC
  Administered 2022-11-29: 10 mg via INTRAVENOUS
  Filled 2022-11-29: qty 2

## 2022-11-29 MED ORDER — OXYCODONE-ACETAMINOPHEN 5-325 MG PO TABS
1.0000 | ORAL_TABLET | Freq: Four times a day (QID) | ORAL | 0 refills | Status: DC | PRN
Start: 2022-11-29 — End: 2024-02-02

## 2022-11-29 MED ORDER — OXYCODONE-ACETAMINOPHEN 5-325 MG PO TABS
1.0000 | ORAL_TABLET | Freq: Once | ORAL | Status: AC
  Administered 2022-11-29: 1 via ORAL
  Filled 2022-11-29: qty 1

## 2022-11-29 MED ORDER — ALPRAZOLAM 0.5 MG PO TABS
1.0000 mg | ORAL_TABLET | Freq: Once | ORAL | Status: AC
  Administered 2022-11-29: 1 mg via ORAL
  Filled 2022-11-29: qty 2

## 2022-11-29 NOTE — ED Triage Notes (Signed)
Pt arrives via POV. Pt reports nose bleed for the past 3 days. Pt was seen at [facility] twice. Last night, a rhinorocket was placed. Pt states he woke up this morning covered in blood. Pt denies blood thinners. He is AxOx4. Pt is very weak and is tachycardic during triage.

## 2022-11-29 NOTE — Discharge Instructions (Signed)
Avoid picking your nose.  Telephone number for Dr. Jearld Fenton is below.  Call as needed for follow-up.  Return to the emergency department for any new or worsening symptoms of concern.

## 2022-11-29 NOTE — ED Notes (Signed)
ENT provider at bedside.

## 2022-11-29 NOTE — ED Provider Notes (Signed)
[place] EMERGENCY DEPARTMENT AT [facility] Provider Note   CSN: [id number] Arrival date & time: 11/29/22  1024     History  Chief Complaint  Patient presents with   Epistaxis    Dan Castaneda is a 47 y.o. male.   Epistaxis Patient presents for epistaxis.  Medical history includes BPH, anxiety, GERD, HTN.  He is not on blood thinners.  Patient reports ongoing epistaxis for the last 3 days.  He was seen previously at [facility], most recently last night.  During that visit, 7.5 cm Rhino Rocket was placed with ongoing bleeding.  Rhino Rocket was placed with a 9 cm with both balloons inflated.  This morning, patient awoke with a large amount of blood on his person in bed.  He has had worsening fatigue.  He has several episodes of emesis that consisted of frank blood.  He states that he has not eaten or drank in the last 2 days.     Home Medications Prior to Admission medications   Medication Sig Start Date End Date Taking? Authorizing Provider  ALPRAZolam Prudy Feeler) 1 MG tablet Take 1 mg by mouth at bedtime as needed. 04/25/21   [provider]  Ascorbic Acid (VITAMIN C) 1000 MG tablet Take 1,000 mg by mouth daily.    [provider]  atenolol (TENORMIN) 50 MG tablet Take 50 mg by mouth daily. 04/23/21   [provider]  cyclobenzaprine (FLEXERIL) 10 MG tablet Take 10 mg by mouth 3 (three) times daily as needed for muscle spasms.  03/17/15   [provider]  diclofenac (VOLTAREN) 75 MG EC tablet Take 75 mg by mouth 2 (two) times daily as needed. 04/23/21   [provider]  DULoxetine (CYMBALTA) 60 MG capsule Take 60 mg by mouth at bedtime. 02/23/15   [provider]  enalapril (VASOTEC) 2.5 MG tablet Take 2.5 mg by mouth daily. 04/29/19 05/15/21  [provider]  glycopyrrolate (ROBINUL) 1 MG tablet Take 1 mg by mouth 2 (two) times daily. 05/02/21   [provider]  ibuprofen (ADVIL) 800 MG tablet Take 1 tablet  (800 mg total) by mouth every 8 (eight) hours as needed. Patient not taking: Reported on 05/15/2021 09/29/19   Vickki Hearing, MD  lansoprazole (PREVACID) 30 MG capsule Take by mouth. 01/21/19   [provider]  loratadine (CLARITIN) 10 MG tablet Take 10 mg by mouth at bedtime. 03/04/15   [provider]  ondansetron (ZOFRAN) 4 MG tablet Take 1 tablet (4 mg total) by mouth every 6 (six) hours. Patient not taking: Reported on 05/15/2021 10/30/15   Jacalyn Lefevre, MD  oxyCODONE-acetaminophen (PERCOCET/ROXICET) 5-325 MG tablet Take 1 tablet by mouth every 6 (six) hours as needed for severe pain. 11/29/22   Gilda Crease, MD  promethazine (PHENERGAN) 25 MG tablet Take 25 mg by mouth every 8 (eight) hours as needed for nausea or vomiting. 05/13/21   [provider]  tamsulosin (FLOMAX) 0.4 MG CAPS capsule Take 0.4 mg by mouth daily. 03/03/15   [provider]  testosterone cypionate (DEPOTESTOTERONE CYPIONATE) 100 MG/ML injection Inject 100 mg into the muscle every 7 (seven) days. 04/20/21   [provider]      Allergies    Codeine and Semaglutide    Review of Systems   Review of Systems  Constitutional:  Positive for activity change, appetite change, diaphoresis and fatigue.  HENT:  Positive for nosebleeds.   Gastrointestinal:  Positive for nausea and vomiting.  Skin:  Positive for pallor.  Neurological:  Positive for weakness (Generalized).  All other systems reviewed and are negative.   Physical Exam Updated Vital Signs BP 112/69   Pulse (!) 109   Temp (!) 97.5 F (36.4 C)   Resp 16   Ht 6\' 1"  (1.854 m)   Wt 117.9 kg   SpO2 98%   BMI 34.30 kg/m  Physical Exam Vitals and nursing note reviewed.  Constitutional:      General: He is not in acute distress.    Appearance: He is well-developed and normal weight. He is ill-appearing and diaphoretic. He is not toxic-appearing.  HENT:     Head: Normocephalic and atraumatic.     Right  Ear: External ear normal.     Left Ear: External ear normal.     Nose:     Comments: Rhino Rocket's in place in right naris.  There is surrounding coagulated blood.  Left naris is occluded with coagulated blood.     Mouth/Throat:     Mouth: Mucous membranes are moist.     Comments: Presence of red blood in oropharynx. Eyes:     Extraocular Movements: Extraocular movements intact.     Conjunctiva/sclera: Conjunctivae normal.  Cardiovascular:     Rate and Rhythm: Regular rhythm. Tachycardia present.     Heart sounds: No murmur heard. Pulmonary:     Effort: Pulmonary effort is normal. No respiratory distress.  Abdominal:     General: There is no distension.     Palpations: Abdomen is soft.     Tenderness: There is no abdominal tenderness.  Musculoskeletal:        General: No swelling. Normal range of motion.     Cervical back: Normal range of motion and neck supple.  Skin:    General: Skin is warm.     Coloration: Skin is pale. Skin is not jaundiced.  Neurological:     General: No focal deficit present.     Mental Status: He is alert and oriented to person, place, and time.  Psychiatric:        Mood and Affect: Mood normal.        Behavior: Behavior normal.     ED Results / Procedures / Treatments   Labs (all labs ordered are listed, but only abnormal results are displayed) Labs Reviewed  CBC - Abnormal; Notable for the following components:      Result Value   WBC 17.7 (*)    All other components within normal limits  COMPREHENSIVE METABOLIC PANEL - Abnormal; Notable for the following components:   Glucose, Bld 149 (*)    BUN 25 (*)    Calcium 8.8 (*)    Total Bilirubin 2.1 (*)    All other components within normal limits  PROTIME-INR  CBG MONITORING, ED  TYPE AND SCREEN    EKG EKG Interpretation Date/Time:  Saturday November 29 2022 11:47:51 EDT Ventricular Rate:  102 PR Interval:  138 QRS Duration:  89 QT Interval:  310 QTC Calculation: 404 R  Axis:   47  Text Interpretation: Age not entered, assumed to be  47 years old for purpose of ECG interpretation Sinus tachycardia Abnormal R-wave progression, early transition Confirmed by Gloris Manchester [phone]) on 11/29/2022 12:27:11 PM  Radiology No results found.  Procedures Procedures    Medications Ordered in ED Medications  lactated ringers bolus 1,000 mL (0 mLs Intravenous Stopped 11/29/22 1248)  metoCLOPramide (REGLAN) injection 10 mg (10 mg Intravenous Given 11/29/22 1238)  lactated ringers bolus 1,000 mL (1,000 mLs Intravenous New Bag/Given 11/29/22 1250)    ED Course/ Medical Decision Making/ A&P                                 Medical Decision Making Amount and/or Complexity of Data Reviewed Labs: ordered.  Risk Prescription drug management.   This patient presents to the ED for concern of epistaxis, this involves an extensive number of treatment options, and is a complaint that carries with it a high risk of complications and morbidity.  The differential diagnosis includes blood loss anemia, hemorrhagic shock, bleeding diathesis, posterior bleed, anterior bleed  Co morbidities that complicate the patient evaluation  BPH, anxiety, GERD, HTN   Additional history obtained:  Additional history obtained from patient's family External records from outside source obtained and reviewed including EMR   Lab Tests:  I Ordered, and personally interpreted labs.  The pertinent results include: No clinically significant drop in hemoglobin when compared to lab work from 2 days ago.  He does have a leukocytosis present.  Kidney function electrolytes are normal.  INR is normal.  Cardiac Monitoring: / EKG:  The patient was maintained on a cardiac monitor.  I personally viewed and interpreted the cardiac monitored which showed an underlying rhythm of: Sinus rhythm   Consultations Obtained:  I requested consultation with the otolaryngologist, Dr. Jearld Fenton,  and discussed lab and  imaging findings as well as pertinent plan - they recommend: After his bedside evaluation, he cauterized sources of bleeding which were in the periphery of a perforated septum.  He was cleared from ENT standpoint.   Problem List / ED Course / Critical interventions / Medication management  Patient presents for epistaxis that has been ongoing for 3 days.  He was seen in the ED twice previously for this.  He had a Rhino Rocket placed last night.  This morning he awoke with blood covering his shirt and sheets.  On arrival, patient is tachycardic.  He is ill-appearing with mild pallor and clamminess.  This raises concern for hemorrhagic shock.  On inspection of his oropharynx, there does appear to be fresh blood.  Externally, both nares are occluded with blood clots.  Rhino Rocket remains in place in right naris.  On initial CBC, patient has only very slight decrease in hemoglobin when compared to lab work from 2 days ago.  He does state that he has had no p.o. intake in the past 2 days.  IV fluids were ordered for hydration.  ENT was consulted.  I spoke with Dr. Jearld Fenton who graciously came and evaluated the patient in the ED.  He was able to identify a septal perforation with sources of bleeding around this perforation.  He cauterized areas of bleeding.  Patient was given Reglan for nausea.  Tachycardia improved.  On reassessment, patient is sleeping.  He has no further epistaxis.  Heart rate has normalized.  He was given food and drink for p.o. challenge.  Patient was able to eat and drink.  After this, he felt much better.  Elevated heart rate may be due to, in part, to anxiety.  When I walk in the room, he has normal heart rate.  It subsequently elevates when he is talking to me.  Patient was discharged in stable condition. I ordered medication including IV fluids for hydration; Reglan for nausea Reevaluation of the patient after these medicines showed that the patient improved  I have reviewed the patients  home medicines and have made adjustments as needed   Social Determinants of Health:  Has PCP         Final Clinical Impression(s) / ED Diagnoses Final diagnoses:  Epistaxis  Perforated nasal septum    Rx / DC Orders ED Discharge Orders     None         Gloris Manchester, MD 11/29/22 1456

## 2022-11-29 NOTE — Consult Note (Signed)
Reason for Consult:epistaxis Referring Physician: Dr Dixon  Dan Castaneda is an 47 y.o. male.  HPI: hx of about 3-4 days of multiple visits to the ER with epistaxis. He is not sure which side started. He has not had significant issues previously. He admits to being a nose picker. It started after doing that activity. No cocaine use. No surgery. He does have controlled HTN.   Past Medical History:  Diagnosis Date   Anxiety    Arthritis    BPH (benign prostatic hyperplasia)    GERD (gastroesophageal reflux disease)    History of kidney stones    Hypertension     Past Surgical History:  Procedure Laterality Date   CHOLECYSTECTOMY     KNEE ARTHROSCOPY WITH LATERAL MENISECTOMY Right 09/29/2019   Procedure: KNEE ARTHROSCOPY WITH LATERAL MENISECTOMY; CHONDROPLASTY OF PATELLA AND TIBIAL PLATEAU;  Surgeon: Vickki Hearing, MD;  Location: [facility];  Service: Orthopedics;  Laterality: Right;    Family History  Problem Relation Age of Onset   Diabetes Mother    Heart disease Mother    Hypertension Mother    Cancer Father    Diabetes Father    Heart disease Father    Hypertension Father     Social History:  reports that he quit smoking about 6 years ago. His smoking use included cigarettes. He started smoking about 28 years ago. He has a 22 pack-year smoking history. He has never used smokeless tobacco. He reports that he does not drink alcohol and does not use drugs.  Allergies:  Allergies  Allergen Reactions   Codeine Hives   Semaglutide Diarrhea    Medications: I have reviewed the patient's current medications.  Results for orders placed or performed during the hospital encounter of 11/29/22 (from the past 48 hour(s))  CBC     Status: Abnormal   Collection Time: 11/29/22 10:41 AM  Result Value Ref Range   WBC 17.7 (H) 4.0 - 10.5 K/uL   RBC 4.90 4.22 - 5.81 MIL/uL   Hemoglobin 13.7 13.0 - 17.0 g/dL   HCT 40.9 81.1 - 91.4 %   MCV 87.3 80.0 - 100.0 fL   MCH 28.0 26.0 - 34.0  pg   MCHC 32.0 30.0 - 36.0 g/dL   RDW 78.2 95.6 - 21.3 %   Platelets 313 150 - 400 K/uL   nRBC 0.0 0.0 - 0.2 %    Comment: Performed at [facility], [address] [place], [place] [address]  Comprehensive metabolic panel     Status: Abnormal   Collection Time: 11/29/22 10:41 AM  Result Value Ref Range   Sodium 135 135 - 145 mmol/L   Potassium 4.1 3.5 - 5.1 mmol/L   Chloride 102 98 - 111 mmol/L   CO2 23 22 - 32 mmol/L   Glucose, Bld 149 (H) 70 - 99 mg/dL    Comment: Glucose reference range applies only to samples taken after fasting for at least 8 hours.   BUN 25 (H) 6 - 20 mg/dL   Creatinine, Ser 8.46 0.61 - 1.24 mg/dL   Calcium 8.8 (L) 8.9 - 10.3 mg/dL   Total Protein 6.8 6.5 - 8.1 g/dL   Albumin 3.9 3.5 - 5.0 g/dL   AST 24 15 - 41 U/L   ALT 32 0 - 44 U/L   Alkaline Phosphatase 43 38 - 126 U/L   Total Bilirubin 2.1 (H) 0.3 - 1.2 mg/dL   GFR, Estimated >96 >29 mL/min    Comment: (  NOTE) Calculated using the CKD-EPI Creatinine Equation (2021)    Anion gap 10 5 - 15    Comment: Performed at [facility], [address] [place], [place] [address]  Type and screen [facility]     Status: None (Preliminary result)   Collection Time: 11/29/22 10:41 AM  Result Value Ref Range   ABO/RH(D) PENDING    Antibody Screen POS    Sample Expiration      12/02/2022,2359 Performed at [facility], [address] [place], [place] [address]     No results found.  ROS Blood pressure (!) 139/95, pulse (!) 107, temperature (!) 97.5 F (36.4 C), resp. rate (!) 25, height 6\' 1"  (1.854 m), weight 117.9 kg, SpO2 100%. Physical Exam HENT:     Head: Normocephalic.     Right Ear: External ear normal.     Left Ear: External ear normal.     Nose:     Comments: Balloon in the right. The left side had large clot removed. The left side was bleeding anterior septum. Multiple pledgets placed with lido and afrin. Cauterized with SN multiple areas around a septal  perforation. The balloon removed from the right side. Clot removed and there was one spot on the edge of the perforation cauterized. There was good hemastasis. The perforation is smooth without evidence of inflammatory or mass changes    Mouth/Throat:     Comments: No bleeding from NP Eyes:     Extraocular Movements: Extraocular movements intact.     Pupils: Pupils are equal, round, and reactive to light.  Neurological:     Mental Status: He is alert.       Assessment/Plan: Epistaxis- he has a perforation and that is why the balloon did not control it. It is now cauterized and ano bleeding. He should stop nose picking. He has the perforation most likely secondary to the continuous trauma. He will stop the activity. Saline spray BID and follow up if further bleeding. There is noevidence that the perforation is inflamed or irregular consistent with some process.  Suzanna Obey 11/29/2022, 12:12 PM

## 2022-12-01 ENCOUNTER — Telehealth: Payer: Self-pay

## 2022-12-01 LAB — CBG MONITORING, ED: Glucose-Capillary: 118 mg/dL — ABNORMAL HIGH (ref 70–99)

## 2022-12-01 MED FILL — Oxycodone w/ Acetaminophen Tab 5-325 MG: ORAL | Qty: 6 | Status: AC

## 2022-12-01 NOTE — Telephone Encounter (Signed)
I tried calling the home number, but the VIR displayed, "Calls are being screened by [facility], and this number is blocked."  I called the mobile phone, and it went straight to voicemail. I left a message to call our office to schedule an ED follow-up regarding Epistaxis with packing.

## 2022-12-03 ENCOUNTER — Encounter (INDEPENDENT_AMBULATORY_CARE_PROVIDER_SITE_OTHER): Payer: Self-pay | Admitting: Otolaryngology

## 2022-12-03 ENCOUNTER — Ambulatory Visit (INDEPENDENT_AMBULATORY_CARE_PROVIDER_SITE_OTHER): Payer: BC Managed Care – PPO | Admitting: Otolaryngology

## 2022-12-03 VITALS — BP 131/85 | HR 88 | Ht 73.0 in | Wt 254.6 lb

## 2022-12-03 DIAGNOSIS — R040 Epistaxis: Secondary | ICD-10-CM

## 2022-12-03 NOTE — Progress Notes (Signed)
Dan Castaneda is an 47 y.o. male.   Chief Complaint: Epistaxis HPI: Patient was seen in the emergency room with significant bleeding and was cauterized around his septal perforation and on the septum.  He has not had any further bleeding.  He is using saline spray.  Prior to the epistaxis he has been using an over-the-counter nasal decongestion spray.  He has some congestion right now.  He has no nasal obstruction.  No pain.  Past Medical History:  Diagnosis Date   Anxiety    Arthritis    BPH (benign prostatic hyperplasia)    GERD (gastroesophageal reflux disease)    History of kidney stones    Hypertension     Past Surgical History:  Procedure Laterality Date   CHOLECYSTECTOMY     KNEE ARTHROSCOPY WITH LATERAL MENISECTOMY Right 09/29/2019   Procedure: KNEE ARTHROSCOPY WITH LATERAL MENISECTOMY; CHONDROPLASTY OF PATELLA AND TIBIAL PLATEAU;  Surgeon: Vickki Hearing, MD;  Location: [facility];  Service: Orthopedics;  Laterality: Right;    Family History  Problem Relation Age of Onset   Diabetes Mother    Heart disease Mother    Hypertension Mother    Cancer Father    Diabetes Father    Heart disease Father    Hypertension Father    Social History:  reports that he quit smoking about 6 years ago. His smoking use included cigarettes. He started smoking about 28 years ago. He has a 22 pack-year smoking history. He has never used smokeless tobacco. He reports that he does not drink alcohol and does not use drugs.  Allergies:  Allergies  Allergen Reactions   Codeine Hives   Semaglutide Diarrhea    (Not in a hospital admission)   No results found for this or any previous visit (from the past 48 hour(s)). No results found.   Blood pressure 131/85, pulse 88, height 6\' 1"  (1.854 m), weight 254 lb 9.6 oz (115.5 kg), SpO2 94%.  PHYSICAL EXAM: Appearance _ awake alert with no distress.  Head- atraumatic and no obvious abnormalities Eyes- PERRL, EOMI, no conjunctiva injection or  ecchymosis Ears-  Right- Pinna without inflammation or swelling. canal without obstruction or injury. TM within normal limits  Left- Pinna without inflammation or swelling. canal without obstruction or injury. TM within normal limits Nose- there is escar where he was caurterized around the perforation no bleeding. No masses. no lesions or masses.  Oc/OP- no lesions or excessive swelling. Mouth opening normal.  Hp/Larynx- normal voice and no airway issues. No swelling or lesions Neck- no mass or lesions. Normal movement.  Neuro- CNII-XII intact, no sensory deficits.  Lungs- normal effort no distress noted     Assessment/Plan Epistaxis and septal perforation-his septal perforation etiology is uncertain at this point.  He does have a strong history of nose picking and likely that is the source.  We talked about other etiologies of septal perforation and that a workup if he has irregularity to the area would be appropriate.  He understands the need to follow-up for reexamination of this. Right now his area is all healing from the cautery so I think follow-up in 1 month would be appropriate.  He likely has some rhinitis medicamentosa from his use of Afrin spray but for now I am not going to treat this until he settled down from the epistaxis healing.  We talked about a nasal steroid spray Nasacort which he could start in about 1-2 weeks.  He will follow-up in 1 month to  reexamine the septal perforation follow-up sooner if there is any further nosebleeds.  Suzanna Obey 12/03/2022, 10:44 AM

## 2023-01-01 ENCOUNTER — Encounter (INDEPENDENT_AMBULATORY_CARE_PROVIDER_SITE_OTHER): Payer: Self-pay | Admitting: Otolaryngology

## 2023-01-01 ENCOUNTER — Ambulatory Visit (INDEPENDENT_AMBULATORY_CARE_PROVIDER_SITE_OTHER): Payer: BC Managed Care – PPO | Admitting: Otolaryngology

## 2023-01-01 VITALS — BP 156/56 | Ht 73.0 in | Wt 254.0 lb

## 2023-01-01 DIAGNOSIS — R040 Epistaxis: Secondary | ICD-10-CM

## 2023-01-01 DIAGNOSIS — H6993 Unspecified Eustachian tube disorder, bilateral: Secondary | ICD-10-CM

## 2023-01-01 NOTE — Progress Notes (Signed)
Dan Castaneda is an 46 y.o. male.   Chief Complaint: septal perforation HPI: hx has no further bleeding. No pain. He did want to talk about a new problem.  He has a chronic issue with pressure in both ears.  It is intermittent.  It feels like the fullness of going up in an airplane.  He has significant issues with pain with flying.  He does not have any vertigo.  No tinnitus.  The hearing does feel full when he is having the pressure but his hearing seems normal when he is not.  Past Medical History:  Diagnosis Date   Anxiety    Arthritis    BPH (benign prostatic hyperplasia)    GERD (gastroesophageal reflux disease)    History of kidney stones    Hypertension     Past Surgical History:  Procedure Laterality Date   CHOLECYSTECTOMY     KNEE ARTHROSCOPY WITH LATERAL MENISECTOMY Right 09/29/2019   Procedure: KNEE ARTHROSCOPY WITH LATERAL MENISECTOMY; CHONDROPLASTY OF PATELLA AND TIBIAL PLATEAU;  Surgeon: Vickki Hearing, MD;  Location: [facility];  Service: Orthopedics;  Laterality: Right;    Family History  Problem Relation Age of Onset   Diabetes Mother    Heart disease Mother    Hypertension Mother    Cancer Father    Diabetes Father    Heart disease Father    Hypertension Father    Social History:  reports that he quit smoking about 6 years ago. His smoking use included cigarettes. He started smoking about 28 years ago. He has a 22 pack-year smoking history. He has never used smokeless tobacco. He reports that he does not drink alcohol and does not use drugs.  Allergies:  Allergies  Allergen Reactions   Codeine Hives   Semaglutide Diarrhea    (Not in a hospital admission)   No results found for this or any previous visit (from the past 48 hour(s)). No results found.   Blood pressure (!) 156/56, height 6\' 1"  (1.854 m), weight 115.2 kg.  PHYSICAL EXAM: Appearance _ awake alert with no distress.  Head- atraumatic and no obvious abnormalities Eyes- PERRL, EOMI, no  conjunctiva injection or ecchymosis Ears-  Right- Pinna without inflammation or swelling. canal without obstruction or injury. TM within normal limits  Left- Pinna without inflammation or swelling. canal without obstruction or injury. TM within normal limits Nose- , perforation is clean and smoth mucosa. No lesions or bleeding. No crusting no masses.  Oc/OP- no lesions or excessive swelling. Mouth opening normal.  Hp/Larynx- normal voice and no airway issues. No swelling or lesions Neck- no mass or lesions. Normal movement.  Neuro- CNII-XII intact, no sensory deficits.  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 I talked to him about biopsy and the medical workup but he prefers to not proceed. I does not look like there is any inflammatory or neoplastic process. Follow up if further issues  Eustachian tube dysfunction-we talked about the medical treatment which she should perform Valsalva maneuvers 10 times per day and use a nasal steroid spray for a 1 to 2 months.  Patient with his conditions that are refractory can get tympanostomy tubes that do work well especially for the flying.  He also should get an audiogram.  He does want to follow-up with one of our ear specialist to proceed with further discussion and workup.  Suzanna Obey 01/01/2023, 9:25 AM

## 2023-01-14 ENCOUNTER — Ambulatory Visit (INDEPENDENT_AMBULATORY_CARE_PROVIDER_SITE_OTHER): Payer: BC Managed Care – PPO | Admitting: Orthopaedic Surgery

## 2023-01-14 ENCOUNTER — Telehealth: Payer: Self-pay | Admitting: Radiology

## 2023-01-14 ENCOUNTER — Other Ambulatory Visit (INDEPENDENT_AMBULATORY_CARE_PROVIDER_SITE_OTHER): Payer: Self-pay

## 2023-01-14 ENCOUNTER — Encounter: Payer: Self-pay | Admitting: Orthopaedic Surgery

## 2023-01-14 ENCOUNTER — Other Ambulatory Visit: Payer: Self-pay | Admitting: Radiology

## 2023-01-14 DIAGNOSIS — G8929 Other chronic pain: Secondary | ICD-10-CM

## 2023-01-14 DIAGNOSIS — M25561 Pain in right knee: Secondary | ICD-10-CM

## 2023-01-14 DIAGNOSIS — M1711 Unilateral primary osteoarthritis, right knee: Secondary | ICD-10-CM | POA: Diagnosis not present

## 2023-01-14 DIAGNOSIS — M25562 Pain in left knee: Secondary | ICD-10-CM | POA: Diagnosis not present

## 2023-01-14 NOTE — Telephone Encounter (Signed)
Right knee hyaluronic acid

## 2023-01-14 NOTE — Progress Notes (Signed)
The patient is a 47 year old gentleman that I am seeing for the first time as a relates to bilateral knee pain with the right knee much more painful than the left.  He has actually had arthroscopic surgery on his right knee 3 years ago in [place].  A MRI of his right knee prior to that surgery showed significant cartilage loss in the lateral aspect of his knee with a significant lateral meniscal tear.  When he walks he has significant valgus malalignment that knee and continued locking catching with that right knee.  He has had steroid injections and is work on activity modification.  At his highest weight he was 350 pounds and he has lost almost 100 pounds and he is down to 260 pounds.  His left knee has had a lot of locking and catching.  He has to force it straight on occasion.  It locks up on him but no grinding like the right knee has.  He does take diclofenac orally and tries Voltaren gel topically.  This has not helped.  He says it is painful to perform his actives daily living as a relates to bilateral knee pain.  Walking and standing as well as taking steps causes severe pain.  His hemoglobin A1c he reports is 5.7.  He is not on medications for diabetes given his weight loss.  I was able to review all of his medications and past medical history within epic.  On exam he has significant valgus malalignment of his right knee and he walks with significant limp.  Both knees have significant pain in the left knee is definitely showing a positive Murray sign to the posterior medial aspect of that knee with no effusion but good range of motion.  There is slight valgus malalignment of the left knee but it is more significant the right knee.  The right knee has a mild effusion as well.  X-rays of the right knee shows severe end-stage arthritis with valgus malalignment and osteophytes in all 3 compartments.  There is bone-on-bone wear of the lateral compartment of the right knee.  The left knee has valgus  malalignment but the joint space appears well-maintained.  At this point he would like to try hyaluronic acid for the right knee and I agree with this considering the arthritis he has with that knee and the failure conservative treatment including all modalities.  I do need to obtain an MRI of his left knee to rule out a meniscal tear given the locking and catching and mechanical symptoms he has with that knee.  We will see him back in follow-up toughly provide hyaluronic acid in the right knee and to go over MRI of the left knee.  He agrees with this treatment plan.  All questions and concerns were addressed and answered.  This patient is diagnosed with osteoarthritis of the knee(s).    Radiographs show evidence of joint space narrowing, osteophytes, subchondral sclerosis and/or subchondral cysts.  This patient has knee pain which interferes with functional and activities of daily living.    This patient has experienced inadequate response, adverse effects and/or intolerance with conservative treatments such as acetaminophen, NSAIDS, topical creams, physical therapy or regular exercise, knee bracing and/or weight loss.   This patient has experienced inadequate response or has a contraindication to intra articular steroid injections for at least 3 months.   This patient is not scheduled to have a total knee replacement within 6 months of starting treatment with viscosupplementation.

## 2023-01-15 ENCOUNTER — Encounter (INDEPENDENT_AMBULATORY_CARE_PROVIDER_SITE_OTHER): Payer: Self-pay

## 2023-01-15 ENCOUNTER — Ambulatory Visit (INDEPENDENT_AMBULATORY_CARE_PROVIDER_SITE_OTHER): Payer: BC Managed Care – PPO | Admitting: Otolaryngology

## 2023-01-15 ENCOUNTER — Ambulatory Visit (INDEPENDENT_AMBULATORY_CARE_PROVIDER_SITE_OTHER): Payer: BC Managed Care – PPO | Admitting: Audiology

## 2023-01-15 VITALS — Ht 72.0 in | Wt 254.0 lb

## 2023-01-15 DIAGNOSIS — H9313 Tinnitus, bilateral: Secondary | ICD-10-CM

## 2023-01-15 DIAGNOSIS — H6993 Unspecified Eustachian tube disorder, bilateral: Secondary | ICD-10-CM | POA: Diagnosis not present

## 2023-01-15 DIAGNOSIS — H9190 Unspecified hearing loss, unspecified ear: Secondary | ICD-10-CM

## 2023-01-15 DIAGNOSIS — J3489 Other specified disorders of nose and nasal sinuses: Secondary | ICD-10-CM

## 2023-01-15 NOTE — Progress Notes (Signed)
Dear Dr. Vear Clock, Here is my assessment for our mutual patient, Dan Castaneda. Thank you for allowing me the opportunity to care for your patient. Please do not hesitate to contact me should you have any other questions. Sincerely, Dr. Jovita Kussmaul  Otolaryngology Clinic Note Referring provider: Dr. Vear Clock HPI:  Dan Castaneda is a 47 y.o. male kindly referred by Dr. Vear Clock for evaluation of follow up for Epistaxis for ear fullness. He was prior seen by Dr. Jearld Fenton for epistaxis in the ED in September, with a known perforation presumed due to nose picking.  He reports his ears have been bothering him forever. Maybe right ear worse. He saw an ENT about 20 years ago for his complaint, and inquired about PE tubes which the ENT did not wish to do. He reports that bilateral ear pressure and some sharp ear pain, and has been going on for several years. Worse over past two months. No significant change in hearing. No drainage. Ears pop a lot, and when they pop he feels an improvement. But then everything becomes muffled again. Significant symptoms when he flies or with pressure changes. Hard time popping ears with this. He tried nasacort and it helped some, but still has a lot of popping ears.   No ear history, lot of ear infections as a kid.Never had any ear tubes. No barotrauma, no vestibular suppressant use.  Significant neck pain - chronic pain (Degenerative disc disease). 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 No presusre/pain face, no anterior rhinorrhea, PND. Takes claritin. Never had allergy testing or sinus surgery. Denies cocaine use or intranasal drug use, denies autoimmune disease or symptoms. Uses saline spray and nasacort.  H&N Surgery: no Personal or FHx of bleeding dz or anesthesia difficulty: no  PMHx: HTN, GERD, Back Pain GLP-1: denies AP/AC: denies  Independent Review of Additional Tests or Records:  CT Venogram (05/2021): No septal perforation noted; minimal sinonasal  disease; mastoids well aerated though cuts thick and suboptimal; carotids covered bilaterally   01/15/23 Audiogram was independently reviewed and interpreted by me and it reveals AU: mild HL upsloping to normal and then higher frequencies down to mild; symmetric; 100% word interpretation at 20dB; type A tympanogram AS, with some negative pressure but not type B (Ad?) AD    SNHL= Sensorineural hearing loss   PMH/Meds/All/SocHx/FamHx/ROS:   Past Medical History:  Diagnosis Date   Anxiety    Arthritis    BPH (benign prostatic hyperplasia)    GERD (gastroesophageal reflux disease)    History of kidney stones    Hypertension      Past Surgical History:  Procedure Laterality Date   CHOLECYSTECTOMY     KNEE ARTHROSCOPY WITH LATERAL MENISECTOMY Right 09/29/2019   Procedure: KNEE ARTHROSCOPY WITH LATERAL MENISECTOMY; CHONDROPLASTY OF PATELLA AND TIBIAL PLATEAU;  Surgeon: Vickki Hearing, MD;  Location: [facility];  Service: Orthopedics;  Laterality: Right;    Family History  Problem Relation Age of Onset   Diabetes Mother    Heart disease Mother    Hypertension Mother    Cancer Father    Diabetes Father    Heart disease Father    Hypertension Father      Social Connections: Not on file     Current Outpatient Medications:    ALPRAZolam (XANAX) 1 MG tablet, Take 1 mg by mouth at bedtime as needed., Disp: , Rfl:    Ascorbic Acid (VITAMIN C) 1000 MG tablet, Take 1,000 mg by mouth daily., Disp: , Rfl:    atenolol (  TENORMIN) 50 MG tablet, Take 50 mg by mouth daily., Disp: , Rfl:    cyclobenzaprine (FLEXERIL) 10 MG tablet, Take 10 mg by mouth 3 (three) times daily as needed for muscle spasms. , Disp: , Rfl: 1   diclofenac (VOLTAREN) 75 MG EC tablet, Take 75 mg by mouth 2 (two) times daily as needed., Disp: , Rfl:    DULoxetine (CYMBALTA) 60 MG capsule, Take 60 mg by mouth at bedtime., Disp: , Rfl: 2   enalapril (VASOTEC) 2.5 MG tablet, Take 2.5 mg by mouth daily., Disp: , Rfl:     glycopyrrolate (ROBINUL) 1 MG tablet, Take 1 mg by mouth 2 (two) times daily., Disp: , Rfl:    glycopyrrolate 0.6 MG/3ML SOSY, 0.6 mg., Disp: , Rfl:    ibuprofen (ADVIL) 800 MG tablet, Take 1 tablet (800 mg total) by mouth every 8 (eight) hours as needed., Disp: 90 tablet, Rfl: 1   lansoprazole (PREVACID) 30 MG capsule, Take by mouth., Disp: , Rfl:    loratadine (CLARITIN) 10 MG tablet, Take 10 mg by mouth at bedtime., Disp: , Rfl: 11   Loratadine 5 MG TBDP, Take 5 mg by mouth daily., Disp: , Rfl:    ondansetron (ZOFRAN) 4 MG tablet, Take 1 tablet (4 mg total) by mouth every 6 (six) hours., Disp: 12 tablet, Rfl: 0   oxyCODONE-acetaminophen (PERCOCET/ROXICET) 5-325 MG tablet, Take 1 tablet by mouth every 6 (six) hours as needed for severe pain., Disp: 6 tablet, Rfl: 0   promethazine (PHENERGAN) 25 MG tablet, Take 25 mg by mouth every 8 (eight) hours as needed for nausea or vomiting., Disp: , Rfl:    tamsulosin (FLOMAX) 0.4 MG CAPS capsule, Take 0.4 mg by mouth daily., Disp: , Rfl: 4   testosterone cypionate (DEPOTESTOTERONE CYPIONATE) 100 MG/ML injection, Inject 100 mg into the muscle every 7 (seven) days., Disp: , Rfl:    Physical Exam:   Ht 6' (1.829 m)   Wt 254 lb (115.2 kg)   BMI 34.45 kg/m    Salient findings:  CN II-XII intact  Bilateral EAC clear and TM intact with well pneumatized middle ear spaces; AS monomeric area posteriorly; he is able to autoinsufflate the ears Weber 512: midline Rinne 512: AC > BC b/l  Rine 1024: AC > BC b/l  Anterior rhinoscopy: Septum with ~1cm clean perforation, no epistaxis. Mucosa does not appear bumpy or abnormal surrounding the edges; bilateral inferior turbinates with mild hypertrophy No lesions of oral cavity/oropharynx; dentition fair No obviously palpable neck masses/lymphadenopathy/thyromegaly No respiratory distress or stridor  Procedures:  None  Impression & Plans:  Dan Castaneda is a 47 y.o. male with:  Bilateral ear discomfort and  fullness/pressure: ddx discussed including referred pain, but given his symptoms, eustachian tube dysfunction seems most likely. Audio with mild HL but symmetric. AD seems more with negative pressure and this seems to correlate with the side the symptoms are the worst - We discussed management:  Observation 2. Nasal sprays - he's tried those and not sufficient benefit. 3. BMT (risks discussed including no benefit and persistent perforation) 4. Eustachian tuboplasty Given his symptoms and after discussion, he wishes for BMT. Will schedule  2. Nasal septal perforation: likely 2/2 nasal trauma? Did not note it last year on CT however. He does not have AI symptoms or Hx, and denies drug use. We discussed further workup including biopsy (though mucosa does not look abnormal currently). He wishes to hold off for now and observe - Continue nasacort BID - Continue  ocean spray PRN  - f/u 2 weeks for BMT   Thank you for allowing me the opportunity to care for your patient. Please do not hesitate to contact me should you have any other questions.  Sincerely, Jovita Kussmaul, MD Otolarynoglogist (ENT), [facility] Phone: [phone] Fax: [phone]  01/15/2023, 2:12 PM   I have personally spent 48 minutes involved in face-to-face and non-face-to-face activities for this patient on the day of the visit.  Professional time spent includes the following activities, in addition to those noted in the documentation: preparing to see the patient (review of outside documentation and results - audio), performing a medically appropriate examination and/or evaluation, counseling and educating the patient/family/caregiver, ordering medications, referring and communicating with other healthcare professionals, documenting clinical information in the electronic or other health record, independently interpreting results and communicating results with the patient/family/caregiver

## 2023-01-15 NOTE — Progress Notes (Signed)
  [address], [address] [place], [place] [address] [phone]  Audiological Evaluation    Name: Dan Castaneda     DOB:   1976-01-12      MRN:   [id number]                                                                                     Service Date: 01/15/2023        Patient was referred today for a hearing evaluation by Dr. Allena Katz.  Symptoms Yes Details  Hearing loss  [x]  Patient reported perceiving hearing loss in both ears.  Tinnitus  [x]  Patient reported experiencing tinnitus in both ears  Balance problems  [x]  Patient reported intermittent vertigo/imbalance sensations for the past couple of months.  Previous ear surgeries  []  Patient denied any previous ear surgeries.  Family history  []  Patient denied family history of hearing loss.  Amplification  []  Patient denied the use of hearing aids.    Otoscopy: Right ear: Clear external ear canal; notable landmarks visualized on the tympanic membrane. Left ear: Clear external ear canal; notable landmarks visualized on the tympanic membrane.    Tympanogram: Right ear: Normal external ear canal volume with normal middle ear pressure and tympanic membrane compliance (Type A). Left ear: Normal external ear canal volume with normal middle ear pressure and tympanic membrane compliance (Type A).    Hearing Evaluation: The audiogram was completed using conventional audiometric techniques under headphones with good reliability.   The hearing test results indicate: Right ear: Normal hearing sensitivity from [phone] Hz sloping to mild hearing loss at 8000 Hz. Left ear: Normal hearing sensitivity from [phone] Hz sloping to moderate hearing loss at 8000 Hz.    Speech Audiometry: Right ear- Speech Reception Threshold (SRT) was obtained at 20 dBHL. Left ear- Speech Reception Threshold (SRT) was obtained at 20 dBHL.   Word Recognition Score Tested using NU-6 (MLV) Right ear: 100% was obtained at a presentation level of 60 dBHL  which is deemed as excellent understanding. Left ear: 100% was obtained at a presentation level of 60 dBHL which is deemed as excellent understanding.   Impression:  There is not a significant difference between puretone thresholds and word recognition scores between ears.   Recommendations: Repeat audiogram when changes are perceived or per MD. Consider various tinnitus strategies, including the use of a noise generator, hearing aids, or tinnitus retraining therapy.   [name], AUD, CCC-A 01/15/23

## 2023-01-15 NOTE — Telephone Encounter (Signed)
VOB submitted for Orthovisc, right knee

## 2023-01-19 ENCOUNTER — Encounter: Payer: Self-pay | Admitting: Orthopaedic Surgery

## 2023-01-30 ENCOUNTER — Ambulatory Visit (INDEPENDENT_AMBULATORY_CARE_PROVIDER_SITE_OTHER): Payer: BC Managed Care – PPO

## 2023-02-03 ENCOUNTER — Ambulatory Visit
Admission: RE | Admit: 2023-02-03 | Discharge: 2023-02-03 | Disposition: A | Discharge status: Home / Self Care | Payer: BC Managed Care – PPO | Source: Ambulatory Visit | Attending: Orthopaedic Surgery | Admitting: Orthopaedic Surgery

## 2023-02-03 DIAGNOSIS — G8929 Other chronic pain: Secondary | ICD-10-CM

## 2023-02-09 ENCOUNTER — Other Ambulatory Visit: Payer: Self-pay

## 2023-02-09 ENCOUNTER — Telehealth: Payer: Self-pay | Admitting: Orthopaedic Surgery

## 2023-02-09 DIAGNOSIS — M1711 Unilateral primary osteoarthritis, right knee: Secondary | ICD-10-CM

## 2023-02-09 NOTE — Telephone Encounter (Signed)
Talked with patient concerning gel injection.  Appts.scheduled for gel injection.

## 2023-02-09 NOTE — Telephone Encounter (Signed)
Patient called and wanted to know if he was approve for GEL injection. CB#[phone]

## 2023-02-23 ENCOUNTER — Ambulatory Visit (INDEPENDENT_AMBULATORY_CARE_PROVIDER_SITE_OTHER): Payer: BC Managed Care – PPO | Admitting: Physician Assistant

## 2023-02-23 ENCOUNTER — Encounter: Payer: Self-pay | Admitting: Physician Assistant

## 2023-02-23 DIAGNOSIS — M1711 Unilateral primary osteoarthritis, right knee: Secondary | ICD-10-CM | POA: Diagnosis not present

## 2023-02-23 MED ORDER — SODIUM HYALURONATE 60 MG/3ML IX PRSY
60.0000 mg | PREFILLED_SYRINGE | INTRA_ARTICULAR | Status: AC | PRN
  Administered 2023-02-23: 60 mg via INTRA_ARTICULAR

## 2023-02-23 MED ORDER — HYALURONAN 30 MG/2ML IX SOSY
30.0000 mg | PREFILLED_SYRINGE | INTRA_ARTICULAR | Status: AC | PRN
  Administered 2023-02-23: 30 mg via INTRA_ARTICULAR

## 2023-02-23 NOTE — Progress Notes (Signed)
   Procedure Note  Patient: Dan Castaneda             Date of Birth: Oct 08, 1975           MRN: [id number]             Visit Date: 02/23/2023  HPI: Dan comes in today for his first of 3 Orthovisc injections right knee.  He has known bone-on-bone lateral compartmental arthritis.  He has failed conservative treatments.  He has no scheduled surgery planned on the right knee in the next 6 months. He also recently had an MRI of his left knee due to mechanical symptoms and pain in the knee.  This shows moderate degenerative changes in all 3 compartments also a horizontal tear involving the lateral meniscus posterior horn that extends up into the mid body region.  Medial meniscus with the peripheral inferior articular surface tear involving the posterior horn into the mid body junction.   Right knee: No abnormal warmth erythema or effusion.  Valgus malalignment.  Overall good range of motion of the right leg.   Procedures: Visit Diagnoses:  1. Unilateral primary osteoarthritis, right knee     Large Joint Inj: R knee on 02/23/2023 4:39 PM Indications: pain Details: 22 G 1.5 in needle, anterolateral approach  Arthrogram: No  Medications: 30 mg Hyaluronan 30 MG/2ML; 60 mg Sodium Hyaluronate 60 MG/3ML Outcome: tolerated well, no immediate complications Procedure, treatment alternatives, risks and benefits explained, specific risks discussed. Consent was given by the patient. Immediately prior to procedure a time out was called to verify the correct patient, procedure, equipment, support staff and site/side marked as required. Patient was prepped and draped in the usual sterile fashion.    Plan: Will see him back in 1 week for his second of 3 Orthovisc injections right knee.  Regards to his left knee he would benefit from a knee arthroscopy however at this point time he was being worked up for possible GI bleed as he does have a low hematocrit and hemoglobin.  He is scheduled for GI workup starting  next Monday.  Will discuss scheduling knee arthroscopy on the left for partial meniscectomies once he has been cleared from medical standpoint/GI workup.  Questions were encouraged and answered.

## 2023-03-02 ENCOUNTER — Ambulatory Visit: Payer: BC Managed Care – PPO | Admitting: Physician Assistant

## 2023-03-05 ENCOUNTER — Ambulatory Visit: Payer: BC Managed Care – PPO | Admitting: Physician Assistant

## 2023-03-05 ENCOUNTER — Encounter: Payer: Self-pay | Admitting: Physician Assistant

## 2023-03-05 DIAGNOSIS — M1711 Unilateral primary osteoarthritis, right knee: Secondary | ICD-10-CM | POA: Diagnosis not present

## 2023-03-05 MED ORDER — HYALURONAN 30 MG/2ML IX SOSY
30.0000 mg | PREFILLED_SYRINGE | INTRA_ARTICULAR | Status: AC | PRN
  Administered 2023-03-05: 30 mg via INTRA_ARTICULAR

## 2023-03-05 MED ORDER — METHYLPREDNISOLONE ACETATE 40 MG/ML IJ SUSP
40.0000 mg | INTRAMUSCULAR | Status: AC | PRN
  Administered 2023-03-05: 40 mg via INTRA_ARTICULAR

## 2023-03-05 NOTE — Progress Notes (Signed)
Office Visit Note   Patient: Dan Castaneda           Date of Birth: [birth year]           MRN: [id number] Visit Date: 03/05/2023              Requested by: Clair Gulling, PA-C [address] [place],  [place] [address] PCP: Clair Gulling, PA-C  Chief Complaint  Patient presents with  . Right Knee - Follow-up      HPI: Dan is a pleasant 47 year old gentleman here for his second Orthovisc injection into his right knee.  Tolerated the first 1 fine  Assessment & Plan: Visit Diagnoses: Osteoarthritis right knee  Plan: Injected without difficulty today will follow-up in about 9 days because of the holiday for final injection  Follow-Up Instructions: No follow-ups on file.   Ortho Exam  Patient is alert, oriented, no adenopathy, well-dressed, normal affect, normal respiratory effort. Right knee no effusion no erythema compartments are soft and compressible neurovascular intact  Imaging: No results found. No images are attached to the encounter.  Labs: No results found for: "HGBA1C", "ESRSEDRATE", "CRP", "LABURIC", "REPTSTATUS", "GRAMSTAIN", "CULT", "LABORGA"   Lab Results  Component Value Date   ALBUMIN 3.9 11/29/2022   ALBUMIN 4.0 11/27/2022   ALBUMIN 4.2 07/22/2018    No results found for: "MG" No results found for: "VD25OH"  No results found for: "PREALBUMIN"    Latest Ref Rng & Units 11/29/2022   10:41 AM 11/27/2022    2:19 PM 05/15/2021    5:03 PM  CBC EXTENDED  WBC 4.0 - 10.5 K/uL 17.7  7.5  17.6   RBC 4.22 - 5.81 MIL/uL 4.90  5.23  6.23   Hemoglobin 13.0 - 17.0 g/dL 44.0  10.2  72.5   HCT 39.0 - 52.0 % 42.8  45.2  53.3   Platelets 150 - 400 K/uL 313  249  353   NEUT# 1.7 - 7.7 K/uL  5.0  11.9   Lymph# 0.7 - 4.0 K/uL  1.8  4.1      There is no height or weight on file to calculate BMI.  Orders:  No orders of the defined types were placed in this encounter.  No orders of the defined types were placed in this encounter.     Procedures: Large Joint Inj on 03/05/2023 9:07 AM Indications: pain and diagnostic evaluation Details: 22 G 1.5 in needle, anteromedial approach  Arthrogram: No  Medications: 40 mg methylPREDNISolone acetate 40 MG/ML; 30 mg Hyaluronan 30 MG/2ML Outcome: tolerated well, no immediate complications Procedure, treatment alternatives, risks and benefits explained, specific risks discussed. Consent was given by the patient.    Clinical Data: No additional findings.  ROS:  All other systems negative, except as noted in the HPI. Review of Systems  Objective: Vital Signs: There were no vitals taken for this visit.  Specialty Comments:  No specialty comments available.  PMFS History: Patient Active Problem List   Diagnosis Date Noted  . Unilateral primary osteoarthritis, right knee 01/14/2023  . S/P right knee arthroscopy 09/29/19  10/03/2019  . Meniscus, lateral, derangement, right   . DDD (degenerative disc disease), lumbar 08/25/2019  . GERD (gastroesophageal reflux disease) 08/25/2019  . Herniated disc, cervical 08/25/2019  . Hypertension 08/25/2019  . IFG (impaired fasting glucose) 08/25/2019  . Benign prostatic hyperplasia with weak urinary stream 04/29/2019  . Vitamin D deficiency 04/29/2019  . Nocturia 02/01/2019  . GAD (generalized anxiety disorder) 09/06/2018  . Morbid  obesity with BMI of 40.0-44.9, adult (HCC) 09/06/2018   Past Medical History:  Diagnosis Date  . Anxiety   . Arthritis   . BPH (benign prostatic hyperplasia)   . GERD (gastroesophageal reflux disease)   . History of kidney stones   . Hypertension     Family History  Problem Relation Age of Onset  . Diabetes Mother   . Heart disease Mother   . Hypertension Mother   . Cancer Father   . Diabetes Father   . Heart disease Father   . Hypertension Father     Past Surgical History:  Procedure Laterality Date  . CHOLECYSTECTOMY    . KNEE ARTHROSCOPY WITH LATERAL MENISECTOMY Right 09/29/2019    Procedure: KNEE ARTHROSCOPY WITH LATERAL MENISECTOMY; CHONDROPLASTY OF PATELLA AND TIBIAL PLATEAU;  Surgeon: Vickki Hearing, MD;  Location: [facility];  Service: Orthopedics;  Laterality: Right;   Social History   Occupational History  . Not on file  Tobacco Use  . Smoking status: Former    Current packs/day: 0.00    Average packs/day: 1 pack/day for 22.0 years (22.0 ttl pk-yrs)    Types: Cigarettes    Start date: 06/27/1994    Quit date: 06/26/2016    Years since quitting: 6.6  . Smokeless tobacco: Never  Vaping Use  . Vaping status: Never Used  Substance and Sexual Activity  . Alcohol use: No  . Drug use: No  . Sexual activity: Yes

## 2023-03-09 ENCOUNTER — Ambulatory Visit: Payer: BC Managed Care – PPO | Admitting: Physician Assistant

## 2023-03-16 ENCOUNTER — Ambulatory Visit (INDEPENDENT_AMBULATORY_CARE_PROVIDER_SITE_OTHER): Payer: BC Managed Care – PPO | Admitting: Physician Assistant

## 2023-03-16 ENCOUNTER — Encounter: Payer: Self-pay | Admitting: Physician Assistant

## 2023-03-16 DIAGNOSIS — M1711 Unilateral primary osteoarthritis, right knee: Secondary | ICD-10-CM

## 2023-03-16 MED ORDER — HYALURONAN 30 MG/2ML IX SOSY
30.0000 mg | PREFILLED_SYRINGE | INTRA_ARTICULAR | Status: AC | PRN
  Administered 2023-03-16: 30 mg via INTRA_ARTICULAR

## 2023-03-16 NOTE — Progress Notes (Signed)
Office Visit Note   Patient: Dan Castaneda           Date of Birth: 1975-04-14           MRN: [id number] Visit Date: 03/16/2023              Requested by: Clair Gulling, PA-C [address] [place],  [place] [address] PCP: Clair Gulling, PA-C  No chief complaint on file.     HPI: Patient is a pleasant 47 year old gentleman who comes in for his third Orthovisc injection into his right knee.  He did have a little stiffness after the second injection but denies any redness or any other difficulties.  He does think he is gotten some help from the injections    Assessment & Plan: Visit Diagnoses: Osteoarthritis right knee  Plan: Injected today without difficulty may follow-up as needed  Follow-Up Instructions: As needed  Ortho Exam  Patient is alert, oriented, no adenopathy, well-dressed, normal affect, normal respiratory effort. Right knee he is neurovascular intact no effusion no erythema compartments are soft and compressible he is ambulating with a normal gait  Imaging: No results found. No images are attached to the encounter.  Labs: No results found for: "HGBA1C", "ESRSEDRATE", "CRP", "LABURIC", "REPTSTATUS", "GRAMSTAIN", "CULT", "LABORGA"   Lab Results  Component Value Date   ALBUMIN 3.9 11/29/2022   ALBUMIN 4.0 11/27/2022   ALBUMIN 4.2 07/22/2018    No results found for: "MG" No results found for: "VD25OH"  No results found for: "PREALBUMIN"    Latest Ref Rng & Units 11/29/2022   10:41 AM 11/27/2022    2:19 PM 05/15/2021    5:03 PM  CBC EXTENDED  WBC 4.0 - 10.5 K/uL 17.7  7.5  17.6   RBC 4.22 - 5.81 MIL/uL 4.90  5.23  6.23   Hemoglobin 13.0 - 17.0 g/dL 33.2  95.1  88.4   HCT 39.0 - 52.0 % 42.8  45.2  53.3   Platelets 150 - 400 K/uL 313  249  353   NEUT# 1.7 - 7.7 K/uL  5.0  11.9   Lymph# 0.7 - 4.0 K/uL  1.8  4.1      There is no height or weight on file to calculate BMI.  Orders:  No orders of the defined types were placed in this  encounter.  No orders of the defined types were placed in this encounter.    Procedures: Large Joint Inj: R knee on 03/16/2023 8:35 AM Indications: pain and diagnostic evaluation Details: 22 G 1.5 in needle, anteromedial approach  Arthrogram: No  Medications: 30 mg Hyaluronan 30 MG/2ML Outcome: tolerated well, no immediate complications Consent was given by the patient. Patient was prepped and draped in the usual sterile fashion.    Clinical Data: No additional findings.  ROS:  All other systems negative, except as noted in the HPI. Review of Systems  Objective: Vital Signs: There were no vitals taken for this visit.  Specialty Comments:  No specialty comments available.  PMFS History: Patient Active Problem List   Diagnosis Date Noted  . Unilateral primary osteoarthritis, right knee 01/14/2023  . S/P right knee arthroscopy 09/29/19  10/03/2019  . Meniscus, lateral, derangement, right   . DDD (degenerative disc disease), lumbar 08/25/2019  . GERD (gastroesophageal reflux disease) 08/25/2019  . Herniated disc, cervical 08/25/2019  . Hypertension 08/25/2019  . IFG (impaired fasting glucose) 08/25/2019  . Benign prostatic hyperplasia with weak urinary stream 04/29/2019  . Vitamin D deficiency 04/29/2019  .  Nocturia 02/01/2019  . GAD (generalized anxiety disorder) 09/06/2018  . Morbid obesity with BMI of 40.0-44.9, adult (HCC) 09/06/2018   Past Medical History:  Diagnosis Date  . Anxiety   . Arthritis   . BPH (benign prostatic hyperplasia)   . GERD (gastroesophageal reflux disease)   . History of kidney stones   . Hypertension     Family History  Problem Relation Age of Onset  . Diabetes Mother   . Heart disease Mother   . Hypertension Mother   . Cancer Father   . Diabetes Father   . Heart disease Father   . Hypertension Father     Past Surgical History:  Procedure Laterality Date  . CHOLECYSTECTOMY    . KNEE ARTHROSCOPY WITH LATERAL MENISECTOMY Right  09/29/2019   Procedure: KNEE ARTHROSCOPY WITH LATERAL MENISECTOMY; CHONDROPLASTY OF PATELLA AND TIBIAL PLATEAU;  Surgeon: Vickki Hearing, MD;  Location: [facility];  Service: Orthopedics;  Laterality: Right;   Social History   Occupational History  . Not on file  Tobacco Use  . Smoking status: Former    Current packs/day: 0.00    Average packs/day: 1 pack/day for 22.0 years (22.0 ttl pk-yrs)    Types: Cigarettes    Start date: 06/27/1994    Quit date: 06/26/2016    Years since quitting: 6.7  . Smokeless tobacco: Never  Vaping Use  . Vaping status: Never Used  Substance and Sexual Activity  . Alcohol use: No  . Drug use: No  . Sexual activity: Yes

## 2023-10-23 ENCOUNTER — Emergency Department (HOSPITAL_BASED_OUTPATIENT_CLINIC_OR_DEPARTMENT_OTHER)

## 2023-10-23 ENCOUNTER — Emergency Department (HOSPITAL_BASED_OUTPATIENT_CLINIC_OR_DEPARTMENT_OTHER): Admitting: Radiology

## 2023-10-23 ENCOUNTER — Other Ambulatory Visit: Payer: Self-pay

## 2023-10-23 ENCOUNTER — Emergency Department (HOSPITAL_BASED_OUTPATIENT_CLINIC_OR_DEPARTMENT_OTHER)
Admission: EM | Admit: 2023-10-23 | Discharge: 2023-10-23 | Disposition: A | Discharge status: Home / Self Care | Attending: Emergency Medicine | Admitting: Emergency Medicine

## 2023-10-23 ENCOUNTER — Encounter (HOSPITAL_BASED_OUTPATIENT_CLINIC_OR_DEPARTMENT_OTHER): Payer: Self-pay

## 2023-10-23 DIAGNOSIS — R072 Precordial pain: Secondary | ICD-10-CM | POA: Diagnosis not present

## 2023-10-23 DIAGNOSIS — R61 Generalized hyperhidrosis: Secondary | ICD-10-CM | POA: Diagnosis not present

## 2023-10-23 DIAGNOSIS — R0602 Shortness of breath: Secondary | ICD-10-CM | POA: Insufficient documentation

## 2023-10-23 DIAGNOSIS — I10 Essential (primary) hypertension: Secondary | ICD-10-CM | POA: Insufficient documentation

## 2023-10-23 DIAGNOSIS — R110 Nausea: Secondary | ICD-10-CM | POA: Insufficient documentation

## 2023-10-23 DIAGNOSIS — R519 Headache, unspecified: Secondary | ICD-10-CM | POA: Insufficient documentation

## 2023-10-23 DIAGNOSIS — Z87891 Personal history of nicotine dependence: Secondary | ICD-10-CM | POA: Diagnosis not present

## 2023-10-23 DIAGNOSIS — R079 Chest pain, unspecified: Secondary | ICD-10-CM | POA: Diagnosis present

## 2023-10-23 LAB — BASIC METABOLIC PANEL WITH GFR
Anion gap: 12 (ref 5–15)
BUN: 14 mg/dL (ref 6–20)
CO2: 31 mmol/L (ref 22–32)
Calcium: 10.4 mg/dL — ABNORMAL HIGH (ref 8.9–10.3)
Chloride: 91 mmol/L — ABNORMAL LOW (ref 98–111)
Creatinine, Ser: 1.02 mg/dL (ref 0.61–1.24)
GFR, Estimated: >60 mL/min (ref >60)
Glucose, Bld: 158 mg/dL — ABNORMAL HIGH (ref 70–99)
Potassium: 4.1 mmol/L (ref 3.5–5.1)
Sodium: 134 mmol/L — ABNORMAL LOW (ref 135–145)

## 2023-10-23 LAB — CBC
HCT: 56.1 % — ABNORMAL HIGH (ref 39.0–52.0)
Hemoglobin: 19.5 g/dL — ABNORMAL HIGH (ref 13.0–17.0)
MCH: 31.6 pg (ref 26.0–34.0)
MCHC: 34.8 g/dL (ref 30.0–36.0)
MCV: 90.9 fL (ref 80.0–100.0)
Platelets: 249 K/uL (ref 150–400)
RBC: 6.17 MIL/uL — ABNORMAL HIGH (ref 4.22–5.81)
RDW: 13.6 % (ref 11.5–15.5)
WBC: 10.3 K/uL (ref 4.0–10.5)
nRBC: 0 % (ref 0.0–0.2)

## 2023-10-23 LAB — TROPONIN T, HIGH SENSITIVITY
Troponin T High Sensitivity: <15 ng/L (ref <19)
Troponin T High Sensitivity: <15 ng/L (ref <19)

## 2023-10-23 MED ORDER — DIPHENHYDRAMINE HCL 50 MG/ML IJ SOLN
12.5000 mg | Freq: Once | INTRAMUSCULAR | Status: AC
  Administered 2023-10-23: 12.5 mg via INTRAVENOUS
  Filled 2023-10-23: qty 1

## 2023-10-23 MED ORDER — ASPIRIN 81 MG PO CHEW
243.0000 mg | CHEWABLE_TABLET | Freq: Once | ORAL | Status: AC
  Administered 2023-10-23: 243 mg via ORAL
  Filled 2023-10-23: qty 3

## 2023-10-23 MED ORDER — PROCHLORPERAZINE EDISYLATE 10 MG/2ML IJ SOLN
10.0000 mg | Freq: Once | INTRAMUSCULAR | Status: AC
  Administered 2023-10-23: 10 mg via INTRAVENOUS
  Filled 2023-10-23: qty 2

## 2023-10-23 MED ORDER — IOHEXOL 350 MG/ML SOLN
125.0000 mL | Freq: Once | INTRAVENOUS | Status: AC | PRN
  Administered 2023-10-23: 125 mL via INTRAVENOUS

## 2023-10-23 MED ORDER — DEXAMETHASONE SODIUM PHOSPHATE 10 MG/ML IJ SOLN
10.0000 mg | Freq: Once | INTRAMUSCULAR | Status: AC
  Administered 2023-10-23: 10 mg via INTRAVENOUS
  Filled 2023-10-23: qty 1

## 2023-10-23 MED ORDER — KETOROLAC TROMETHAMINE 15 MG/ML IJ SOLN
15.0000 mg | Freq: Once | INTRAMUSCULAR | Status: AC
  Administered 2023-10-23: 15 mg via INTRAVENOUS
  Filled 2023-10-23: qty 1

## 2023-10-23 NOTE — ED Triage Notes (Signed)
 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 Slight dull ache to left jaw. Pt states he was pouring sweat. Pt also c/o headache since Tuesday.

## 2023-10-23 NOTE — ED Provider Notes (Signed)
 [facility] AT [facility] Provider Note   CSN: [id number] Arrival date & time: 10/23/23  1428     Patient presents with: No chief complaint on file.   Dan Castaneda is a 48 y.o. male.   Patient with history of hypertension presents today with complaints of chest pain. Reports that same began around 10:30 this morning when he was sitting at his desk at work. Reports pain was in his left anterior chest and radiated to his jaw. Denies history of similar pain previously. Reports that he has some nausea and diaphoresis as well. Reports some shortness of breath.  Denies recent travel or surgeries, no leg pain or leg swelling. Also patient reports that he has had a headache since Tuesday. Pain wraps around his head. Denies history of similar headaches previously. Tried ibuprofen  without significant improvement. Denies vision changes, neck stiffness, fevers, or chills. Of note, patient does take 81 mg aspirin  every day. Patient is a former smoker, stopped over 5 years ago.  Denies recreational drug use.  Does report that he has significant family history of sudden cardiac death in both his parents when they were in their 8s.  The history is provided by the patient. No language interpreter was used.       Prior to Admission medications   Medication Sig Start Date End Date Taking? Authorizing Provider  ALPRAZolam  (XANAX ) 1 MG tablet Take 1 mg by mouth at bedtime as needed. 04/25/21   [provider]  Ascorbic Acid (VITAMIN C) 1000 MG tablet Take 1,000 mg by mouth daily.    [provider]  atenolol (TENORMIN) 50 MG tablet Take 50 mg by mouth daily. 04/23/21   [provider]  cyclobenzaprine (FLEXERIL) 10 MG tablet Take 10 mg by mouth 3 (three) times daily as needed for muscle spasms.  03/17/15   [provider]  diclofenac  (VOLTAREN ) 75 MG EC tablet Take 75 mg by mouth 2 (two) times daily as needed. 04/23/21   [provider]   DULoxetine (CYMBALTA) 60 MG capsule Take 60 mg by mouth at bedtime. 02/23/15   [provider]  enalapril (VASOTEC) 2.5 MG tablet Take 2.5 mg by mouth daily. 04/29/19 05/15/21  [provider]  glycopyrrolate (ROBINUL) 1 MG tablet Take 1 mg by mouth 2 (two) times daily. 05/02/21   [provider]  glycopyrrolate 0.6 MG/3ML SOSY 0.6 mg.    [provider]  ibuprofen  (ADVIL ) 800 MG tablet Take 1 tablet (800 mg total) by mouth every 8 (eight) hours as needed. 09/29/19   Margrette Taft BRAVO, MD  lansoprazole (PREVACID) 30 MG capsule Take by mouth. 01/21/19   [provider]  loratadine (CLARITIN) 10 MG tablet Take 10 mg by mouth at bedtime. 03/04/15   [provider]  Loratadine 5 MG TBDP Take 5 mg by mouth daily.    [provider]  ondansetron  (ZOFRAN ) 4 MG tablet Take 1 tablet (4 mg total) by mouth every 6 (six) hours. 10/30/15   Dean Clarity, MD  oxyCODONE -acetaminophen  (PERCOCET/ROXICET) 5-325 MG tablet Take 1 tablet by mouth every 6 (six) hours as needed for severe pain. 11/29/22   Haze Lonni PARAS, MD  promethazine (PHENERGAN) 25 MG tablet Take 25 mg by mouth every 8 (eight) hours as needed for nausea or vomiting. 05/13/21   [provider]  tamsulosin (FLOMAX) 0.4 MG CAPS capsule Take 0.4 mg by mouth daily. 03/03/15   [provider]  testosterone cypionate (DEPOTESTOTERONE CYPIONATE) 100 MG/ML  injection Inject 100 mg into the muscle every 7 (seven) days. 04/20/21   [provider]    Allergies: Codeine and Semaglutide    Review of Systems  Cardiovascular:  Positive for chest pain.  Neurological:  Positive for headaches.  All other systems reviewed and are negative.   Updated Vital Signs BP 124/86   Pulse 83   Temp 97.7 F (36.5 C)   Resp 16   Ht 6' 1 (1.854 m)   Wt 117.5 kg   SpO2 99%   BMI 34.17 kg/m   Physical Exam Vitals and nursing note reviewed.  Constitutional:      General: He  is not in acute distress.    Appearance: Normal appearance. He is normal weight. He is not ill-appearing, toxic-appearing or diaphoretic.     Comments: Uncomfortable appearing  HENT:     Head: Normocephalic and atraumatic.  Eyes:     Extraocular Movements: Extraocular movements intact.     Pupils: Pupils are equal, round, and reactive to light.  Neck:     Comments: No meningismus Cardiovascular:     Rate and Rhythm: Normal rate and regular rhythm.     Heart sounds: Normal heart sounds.  Pulmonary:     Effort: Pulmonary effort is normal. No respiratory distress.     Breath sounds: Normal breath sounds.  Abdominal:     General: Abdomen is flat.     Palpations: Abdomen is soft.     Tenderness: There is no abdominal tenderness.  Musculoskeletal:        General: Normal range of motion.     Cervical back: Normal range of motion and neck supple.     Right lower leg: No edema.     Left lower leg: No edema.  Skin:    General: Skin is warm and dry.  Neurological:     General: No focal deficit present.     Mental Status: He is alert and oriented to person, place, and time.     GCS: GCS eye subscore is 4. GCS verbal subscore is 5. GCS motor subscore is 6.     Sensory: Sensation is intact.     Motor: Motor function is intact.     Coordination: Coordination is intact.     Gait: Gait is intact.     Comments: Alert and oriented to self, place, time and event.    Speech is fluent, clear without dysarthria or dysphasia.    Strength 5/5 in upper/lower extremities   Sensation intact in upper/lower extremities    CN I not tested  CN II grossly intact visual fields bilaterally. Did not visualize posterior eye.  CN III, IV, VI PERRLA and EOMs intact bilaterally  CN V Intact sensation to sharp and light touch to the face  CN VII facial movements symmetric  CN VIII not tested  CN IX, X no uvula deviation, symmetric rise of soft palate  CN XI 5/5 SCM and trapezius strength bilaterally  CN XII  Midline tongue protrusion, symmetric L/R movements   Psychiatric:        Mood and Affect: Mood normal.        Behavior: Behavior normal.     (all labs ordered are listed, but only abnormal results are displayed) Labs Reviewed  BASIC METABOLIC PANEL WITH GFR - Abnormal; Notable for the following components:      Result Value   Sodium 134 (*)    Chloride 91 (*)    Glucose, Bld 158 (*)  Calcium 10.4 (*)    All other components within normal limits  CBC - Abnormal; Notable for the following components:   RBC 6.17 (*)    Hemoglobin 19.5 (*)    HCT 56.1 (*)    All other components within normal limits  TROPONIN T, HIGH SENSITIVITY  TROPONIN T, HIGH SENSITIVITY    EKG: EKG Interpretation Date/Time:  Friday October 23 2023 14:37:59 EDT Ventricular Rate:  90 PR Interval:  148 QRS Duration:  86 QT Interval:  326 QTC Calculation: 398 R Axis:   35  Text Interpretation: Normal sinus rhythm Normal ECG When compared with ECG of 29-Nov-2022 11:47, PREVIOUS ECG IS PRESENT Confirmed by Dasie Faden ([id number]) on 10/23/2023 3:28:01 PM  Radiology: CT ANGIO HEAD NECK W WO CM Result Date: 10/23/2023 EXAM: CTA Head and Neck with Intravenous Contrast. CT Head without Contrast. CLINICAL HISTORY: Headache, sudden, severe. Acute onset left sided chest pain, left mandible pain. Diaphoretic. TECHNIQUE: Axial CTA images of the head and neck performed with intravenous contrast. MIP reconstructed images were created and reviewed. Axial computed tomography images of the head/brain performed without intravenous contrast. Note: Per PQRS, the description of internal carotid artery percent stenosis, including 0 percent or normal exam, is based on [place] American Symptomatic Carotid Endarterectomy Trial (NASCET) criteria. Dose reduction technique was used including one or more of the following: automated exposure control, adjustment of mA and kV according to patient size, and/or iterative reconstruction. CONTRAST: 125  mL iohexol  (OMNIPAQUE ) 350 MG/ML injection 125 mL IOHEXOL  350 MG/ML SOLN. COMPARISON: CT head dated 05/15/2021. FINDINGS: CT HEAD: On thenoncontrast study, the brain appears normal. The brain remains normal in appearance following the administration of intravenous contrast. BRAIN: No acute intraparenchymal hemorrhage. No mass lesion. No CT evidence for acute territorial infarct. No midline shift or extra-axial collection. VENTRICLES: No hydrocephalus. ORBITS: The orbits are unremarkable. SINUSES AND MASTOIDS: The paranasal sinuses and mastoid air cells are clear. CTA NECK: COMMON CAROTID ARTERIES: No significant stenosis. No dissection or occlusion. The common carotid arteries are normal in caliber bilaterally. INTERNAL CAROTID ARTERIES: No stenosis by NASCET criteria. No dissection or occlusion. There is minimal calcific plaque within the origins of the internal carotid arteries. The cervical segments of the internal carotid arteries are normal in caliber bilaterally. The cranial and cavernous segments of the internal carotid arteries are normal in caliber. VERTEBRAL ARTERIES: No significant stenosis. No dissection or occlusion. The vertebral arteries are widely patent. CTA HEAD: ANTERIOR CEREBRAL ARTERIES: No significant stenosis. No occlusion. No aneurysm. The anterior cerebral arteries and their branches appear normal in caliber. MIDDLE CEREBRAL ARTERIES: No significant stenosis. No occlusion. No aneurysm. The middle cerebral arteries and their branches appear normal in caliber. POSTERIOR CEREBRAL ARTERIES: No significant stenosis. No occlusion. No aneurysm. There are fetal type origins of the posterior cerebral arteries bilaterally. The posterior cerebral arteries and branches are normal in caliber. BASILAR ARTERY: No significant stenosis. No occlusion. No aneurysm. OTHER: There is 3-vessel takeoff of the great arteries. SOFT TISSUES: No acute finding. No masses or lymphadenopathy. BONES: No acute osseous  abnormality. IMPRESSION: 1. No acute intracranial hemorrhage or ischemic change. 2. No evidence of significant stenosis, aneurysmal dilatation, or dissection involving the arteries of the head and neck. Electronically signed by: evalene coho 10/23/2023 04:21 PM EDT RP Workstation: [id number]   CT Angio Chest/Abd/Pel for Dissection W and/or Wo Contrast Result Date: 10/23/2023 EXAM: CTA CHEST, ABDOMEN AND PELVIS WITH AND WITHOUT CONTRAST 10/23/2023 03:59:17 PM TECHNIQUE: CTA of the chest was performed  with and without the administration of intravenous contrast. CTA of the abdomen and pelvis was performed with the administration of intravenous contrast. Multiplanar reformatted images are provided for review. MIP images are provided for review. Automated exposure control, iterative reconstruction, and/or weight based adjustment of the mA/kV was utilized to reduce the radiation dose to as low as reasonably achievable. COMPARISON: CT abdomen and pelvis. 10/29/2025 CLINICAL HISTORY: Acute aortic syndrome (AAS) suspected. Acute onset left sided chest pain, left mandible pain. Diaphoretic. FINDINGS: VASCULATURE: AORTA: Minimal partially calcified plaque in the descending thoracic aorta. No acute finding. No abdominal aortic aneurysm. No dissection. PULMONARY ARTERIES: No pulmonary embolism with the limits of this exam. GREAT VESSELS OF AORTIC ARCH: Bovine variant aortic arch anatomy. No acute finding. No dissection. No arterial occlusion or significant stenosis. CELIAC TRUNK: No acute finding. No occlusion or significant stenosis. SUPERIOR MESENTERIC ARTERY: No acute finding. No occlusion or significant stenosis. INFERIOR MESENTERIC ARTERY: No acute finding. No occlusion or significant stenosis. RENAL ARTERIES: Duplicate right renal artery, superior dominant. No occlusion or significant stenosis. ILIAC ARTERIES: Scattered aortoiliac calcific plaque without aneurysm or stenosis. No acute finding. No occlusion or  significant stenosis. CHEST: MEDIASTINUM: No mediastinal lymphadenopathy. The heart and pericardium demonstrate no acute abnormality. LUNGS AND PLEURA: The lungs are without acute process. No focal consolidation or pulmonary edema. No evidence of pleural effusion or pneumothorax. THORACIC BONES AND SOFT TISSUES: No acute bone or soft tissue abnormality. ABDOMEN AND PELVIS: LIVER: The liver is unremarkable. GALLBLADDER AND BILE DUCTS: Cholecystectomy clips. No biliary ductal dilatation. SPLEEN: The spleen is unremarkable. PANCREAS: The pancreas is unremarkable. ADRENAL GLANDS: Bilateral adrenal glands demonstrate no acute abnormality. KIDNEYS, URETERS AND BLADDER: Contrast material and decompressed renal collecting systems. No hydronephrosis. No perinephric or periureteral stranding. Urinary bladder is unremarkable. GI AND BOWEL: Stomach and duodenal sweep demonstrate no acute abnormality. There is no bowel obstruction. No abnormal bowel wall thickening or distension. Normal appendix. REPRODUCTIVE: Reproductive organs are unremarkable. PERITONEUM AND RETROPERITONEUM: No ascites or free air. LYMPH NODES: No lymphadenopathy. ABDOMINAL BONES AND SOFT TISSUES: Multilevel lumbar spondylotic change L3 - S1. No acute abnormality of the bones. No acute soft tissue abnormality. IMPRESSION: 1. No evidence of acute aortic syndrome. Electronically signed by: Dayne Hassell MD 10/23/2023 04:05 PM EDT RP Workstation: [id number]     Procedures   Medications Ordered in the ED  aspirin  chewable tablet 243 mg (243 mg Oral Given 10/23/23 1533)  iohexol  (OMNIPAQUE ) 350 MG/ML injection 125 mL (125 mLs Intravenous Contrast Given 10/23/23 1544)                                    Medical Decision Making Amount and/or Complexity of Data Reviewed Labs: ordered. Radiology: ordered.  Risk OTC drugs. Prescription drug management.   This patient is a 48 y.o. male who presents to the ED for concern of headache, chest pain, this  involves an extensive number of treatment options, and is a complaint that carries with it a high risk of complications and morbidity. The emergent differential diagnosis prior to evaluation includes, but is not limited to,    Headache: Stroke, increased ICP, meningitis, CVA, intracranial tumor, venous sinus thrombosis, migraine, cluster headache, hypertension, drug related, head injury, tension headache, sinusitis, dental abscess, otitis media, TMJ, trigeminal neuralgia.  Chest pain: ACS, pericarditis, myocarditis, aortic dissection, PE, pneumothorax, esophageal rupture, pneumonia, reflux/PUD, biliary disease, pancreatitis, costochondritis, anxiety  This is not an exhaustive  differential.   Past Medical History / Co-morbidities / Social History:  has a past medical history of Anxiety, Arthritis, BPH (benign prostatic hyperplasia), GERD (gastroesophageal reflux disease), History of kidney stones, and Hypertension.  Additional history: Chart reviewed.  Physical Exam: Physical exam performed. The pertinent findings include: Uncomfortable appearing, alert and oriented and neurologically intact without focal deficits.  No acute physical exam abnormalities.  Lab Tests: I ordered, and personally interpreted labs.  The pertinent results include:  Na 134, chloride 91, hgb 19.5, delta troponin WNL   Imaging Studies: I ordered imaging studies including CTA head and neck, CTA chest abdomen pelvis. I independently visualized and interpreted imaging which showed no acute findings  I agree with the radiologist interpretation.   Cardiac Monitoring:  The patient was maintained on a cardiac monitor.  My attending physician Dr. Dasie viewed and interpreted the cardiac monitored which showed an underlying rhythm of: sinus rhythm, no STEMI. I agree with this interpretation.   Medications: I ordered medication including aspirin , compazine , benadryl , decadron , toradol   for chest pain, headache. Reevaluation  of the patient after these medicines showed that the patient improved. I have reviewed the patients home medicines and have made adjustments as needed.   Disposition: After consideration of the diagnostic results and the patients response to treatment, I feel that emergency department workup does not suggest an emergent condition requiring admission or immediate intervention beyond what has been performed at this time. The plan is: Discharge with close outpatient follow-up and return precautions.  After above interventions, patient feels better and is ready to go home.  His workup is benign.  His chest pain has completely resolved and his headache is improving.  He has no signs or symptoms to suggest SAH, no indication for LP at this time.  Discussed same with patient is understanding and in agreement with this.  He did have a somewhat concerning story for cardiac etiology, however work-up is benign and his HEART score is 3, low risk.  His pain is also resolved.  Given this, no indication for admission at this time.  Will give referral for cardiology follow-up.  Evaluation and diagnostic testing in the emergency department does not suggest an emergent condition requiring admission or immediate intervention beyond what has been performed at this time.  Plan for discharge with close PCP follow-up.  Patient is understanding and amenable with plan, educated on red flag symptoms that would prompt immediate return.  Patient discharged in stable condition.   I discussed this case with my attending physician Dr. Dasie who cosigned this note including patient's presenting symptoms, physical exam, and planned diagnostics and interventions. Attending physician stated agreement with plan or made changes to plan which were implemented.    Final diagnoses:  Bad headache  Precordial chest pain    ED Discharge Orders          Ordered    Ambulatory referral to Cardiology        10/23/23 1916          An After  Visit Summary was printed and given to the patient.      Nora Lauraine DELENA DEVONNA 10/23/23 1919    Dasie Faden, MD 10/26/23 [phone]

## 2023-10-23 NOTE — ED Notes (Signed)
 ED Provider at bedside.

## 2023-10-23 NOTE — ED Notes (Signed)
 Patient transported to CT

## 2023-10-23 NOTE — Discharge Instructions (Signed)
 As we discussed, your workup in the ER today was reassuring for acute findings.  Laboratory evaluation, EKG, and CT imaging did not reveal any emergent cause of your symptoms.  Given that you feel better with interventions today, no further evaluation is indicated.  However, given your family and personal history as well as your symptoms today, I think you would benefit from following up with a cardiologist.  I have given you a referral.  You should hear from them in the next few days, if you do not there is a number attached that you can call for an appointment.  Please call your PCP to schedule a close follow-up appointment as well.  Return if development of any new or worsening symptoms

## 2024-01-18 ENCOUNTER — Encounter: Payer: Self-pay | Admitting: Radiology

## 2024-02-02 ENCOUNTER — Ambulatory Visit
Admission: RE | Admit: 2024-02-02 | Discharge: 2024-02-02 | Disposition: A | Discharge status: Home / Self Care | Attending: Family Medicine | Admitting: Family Medicine

## 2024-02-02 ENCOUNTER — Other Ambulatory Visit: Payer: Self-pay

## 2024-02-02 ENCOUNTER — Ambulatory Visit

## 2024-02-02 VITALS — BP 126/82 | HR 74 | Temp 97.9°F | Resp 16 | Ht 72.0 in | Wt 265.0 lb

## 2024-02-02 DIAGNOSIS — M25561 Pain in right knee: Secondary | ICD-10-CM

## 2024-02-02 DIAGNOSIS — M542 Cervicalgia: Secondary | ICD-10-CM

## 2024-02-02 DIAGNOSIS — M25562 Pain in left knee: Secondary | ICD-10-CM | POA: Diagnosis not present

## 2024-02-02 MED ORDER — METHYLPREDNISOLONE 4 MG PO TBPK: ORAL_TABLET | ORAL | 0 refills | Status: DC

## 2024-02-02 MED ORDER — TIZANIDINE HCL 4 MG PO TABS: 4.0000 mg | ORAL_TABLET | Freq: Four times a day (QID) | ORAL | 0 refills | Status: AC | PRN

## 2024-02-02 NOTE — ED Triage Notes (Signed)
 Pt presenting c/o pain to bilateral knees, neck, head and light sensitivity post fall x 6 days ago. Pt stated that he took scheduled po meds for pain with no effectiveness.

## 2024-02-02 NOTE — ED Provider Notes (Signed)
 [facility]    CSN: [id number] Arrival date & time: 02/02/24  1809      History   Chief Complaint Chief Complaint  Patient presents with   Fall    I fell on Wednesday, November 12 at work and have been having pain since. The pain has gradually gotten worse and I may have fractured something in my knee - Entered by patient    HPI Dan Castaneda is a 48 y.o. male.   HPI  Patient states that he tripped over a box on the floor and fell at work.  He fell forward and landed on both flexed knees.  He landed on his hands and then hit his head on the floor.  He did not lose consciousness.  Ever since then he has had pain in both knees.  Pain in his neck.  Stiffness neck.  Numbness in both hands.  Headaches.  Photophobia.  Is here for evaluation of injuries. Has known arthritis in her knees.  Has had knee surgery, right.  Past Medical History:  Diagnosis Date   Anxiety    Arthritis    BPH (benign prostatic hyperplasia)    GERD (gastroesophageal reflux disease)    History of kidney stones    Hypertension     Patient Active Problem List   Diagnosis Date Noted   Unilateral primary osteoarthritis, right knee 01/14/2023   S/P right knee arthroscopy 09/29/19  10/03/2019   Meniscus, lateral, derangement, right    DDD (degenerative disc disease), lumbar 08/25/2019   GERD (gastroesophageal reflux disease) 08/25/2019   Herniated disc, cervical 08/25/2019   Hypertension 08/25/2019   IFG (impaired fasting glucose) 08/25/2019   Benign prostatic hyperplasia with weak urinary stream 04/29/2019   Vitamin D deficiency 04/29/2019   Nocturia 02/01/2019   GAD (generalized anxiety disorder) 09/06/2018   Morbid obesity with BMI of 40.0-44.9, adult (HCC) 09/06/2018    Past Surgical History:  Procedure Laterality Date   CHOLECYSTECTOMY     KNEE ARTHROSCOPY WITH LATERAL MENISECTOMY Right 09/29/2019   Procedure: KNEE ARTHROSCOPY WITH LATERAL MENISECTOMY; CHONDROPLASTY OF PATELLA AND  TIBIAL PLATEAU;  Surgeon: Margrette Taft BRAVO, MD;  Location: [facility];  Service: Orthopedics;  Laterality: Right;       Home Medications    Prior to Admission medications   Medication Sig Start Date End Date Taking? Authorizing Provider  methylPREDNISolone  (MEDROL  DOSEPAK) 4 MG TBPK tablet tad 02/02/24  Yes Maranda Jamee Jacob, MD  tiZANidine (ZANAFLEX) 4 MG tablet Take 1-2 tablets (4-8 mg total) by mouth every 6 (six) hours as needed for muscle spasms. 02/02/24  Yes Maranda Jamee Jacob, MD  ALPRAZolam  (XANAX ) 1 MG tablet Take 1 mg by mouth at bedtime as needed. 04/25/21   [provider]  Ascorbic Acid (VITAMIN C) 1000 MG tablet Take 1,000 mg by mouth daily.    [provider]  atenolol (TENORMIN) 50 MG tablet Take 50 mg by mouth daily. 04/23/21   [provider]  DULoxetine (CYMBALTA) 60 MG capsule Take 60 mg by mouth at bedtime. 02/23/15   [provider]  enalapril (VASOTEC) 2.5 MG tablet Take 2.5 mg by mouth daily. 04/29/19 05/15/21  [provider]  lansoprazole (PREVACID) 30 MG capsule Take by mouth. 01/21/19   [provider]  loratadine (CLARITIN) 10 MG tablet Take 10 mg by mouth at bedtime. 03/04/15   [provider]  tamsulosin (FLOMAX) 0.4 MG CAPS capsule Take 0.4 mg by mouth daily. 03/03/15   [provider]  testosterone cypionate (DEPOTESTOTERONE CYPIONATE) 100 MG/ML injection Inject 100 mg into the muscle every 7 (seven) days. 04/20/21   [provider]    Family History Family History  Problem Relation Age of Onset   Diabetes Mother    Heart disease Mother    Hypertension Mother    Cancer Father    Diabetes Father    Heart disease Father    Hypertension Father     Social History Social History   Tobacco Use   Smoking status: Former    Current packs/day: 0.00    Average packs/day: 1 pack/day for 22.0 years (22.0 ttl pk-yrs)    Types: Cigarettes    Start date: 06/27/1994    Quit date:  06/26/2016    Years since quitting: 7.6   Smokeless tobacco: Never  Vaping Use   Vaping status: Never Used  Substance Use Topics   Alcohol use: No   Drug use: No     Allergies   Codeine and Semaglutide   Review of Systems Review of Systems  See HPI Physical Exam Triage Vital Signs ED Triage Vitals  Encounter Vitals Group     BP 02/02/24 1854 126/82     Girls Systolic BP Percentile --      Girls Diastolic BP Percentile --      Boys Systolic BP Percentile --      Boys Diastolic BP Percentile --      Pulse Rate 02/02/24 1854 74     Resp 02/02/24 1854 16     Temp 02/02/24 1854 97.9 F (36.6 C)     Temp Source 02/02/24 1854 Oral     SpO2 02/02/24 1854 95 %     Weight 02/02/24 1858 265 lb (120.2 kg)     Height 02/02/24 1858 6' (1.829 m)     Head Circumference --      Peak Flow --      Pain Score 02/02/24 1856 6     Pain Loc --      Pain Education --      Exclude from Growth Chart --    No data found.  Updated Vital Signs BP 126/82 (BP Location: Right Arm)   Pulse 74   Temp 97.9 F (36.6 C) (Oral)   Resp 16   Ht 6' (1.829 m)   Wt 120.2 kg   SpO2 95%   BMI 35.94 kg/m       Physical Exam Constitutional:      General: He is in acute distress.     Appearance: He is well-developed.     Comments: Overweight.  Squinting  HENT:     Head: Normocephalic and atraumatic.  Eyes:     Conjunctiva/sclera: Conjunctivae normal.     Pupils: Pupils are equal, round, and reactive to light.  Cardiovascular:     Rate and Rhythm: Normal rate.  Pulmonary:     Effort: Pulmonary effort is normal. No respiratory distress.  Musculoskeletal:        General: Swelling and tenderness present. Normal range of motion.     Cervical back: Normal range of motion.     Comments: Both anterior knees are bruised and discolored.  Right greater than left.  Mild tenderness in the paraspinous neck muscles with limited range of motion.  Neuroexam normal in upper extremities  Skin:    General:  Skin is warm and dry.  Neurological:     Mental Status: He is alert.   Patient can fully flex and extend his  knees.  No instability.   UC Treatments / Results  Labs (all labs ordered are listed, but only abnormal results are displayed) Labs Reviewed - No data to display  EKG   Radiology No results found.  Procedures Procedures (including critical care time)  Medications Ordered in UC Medications - No data to display  Initial Impression / Assessment and Plan / UC Course  I have reviewed the triage vital signs and the nursing notes.  Pertinent labs & imaging results that were available during my care of the patient were reviewed by me and considered in my medical decision making (see chart for details).     Patient is here at closing time.  Unfortunately x-rays are not read at the time he leaves.  His right knee does have some early osteoarthritic change.  No acute change or fracture.  Left knee is benign.  His cervical spine does show some early degenerative disc disease with no acute findings.  This is discussed with patient.  He understands to be called if the radiologist identifies any other worrisome findings Final Clinical Impressions(s) / UC Diagnoses   Final diagnoses:  Anterior knee pain, right  Anterior knee pain, left  Acute neck pain     Discharge Instructions      Steroids are the most potent anti-inflammatory to take down pinched nerve symptoms and swelling.  Take the Medrol  Dosepak as directed.  Take all of day 1 today.  Take 3 pills now and then 3 pills at bedtime, tomorrow take according to schedule Tizanidine as a mild muscle relaxer.  It is nonnarcotic.  Take 2 at bedtime Reduce activity while your joints are painful See your doctor if not improving by next week     ED Prescriptions     Medication Sig Dispense Auth. Provider   methylPREDNISolone  (MEDROL  DOSEPAK) 4 MG TBPK tablet tad 21 tablet Maranda Jamee Jacob, MD   tiZANidine (ZANAFLEX) 4 MG  tablet Take 1-2 tablets (4-8 mg total) by mouth every 6 (six) hours as needed for muscle spasms. 21 tablet Maranda Jamee Jacob, MD      I have reviewed the PDMP during this encounter.   Maranda Jamee Jacob, MD 02/02/24 KENITH

## 2024-02-02 NOTE — Discharge Instructions (Signed)
 Steroids are the most potent anti-inflammatory to take down pinched nerve symptoms and swelling.  Take the Medrol  Dosepak as directed.  Take all of day 1 today.  Take 3 pills now and then 3 pills at bedtime, tomorrow take according to schedule Tizanidine as a mild muscle relaxer.  It is nonnarcotic.  Take 2 at bedtime Reduce activity while your joints are painful See your doctor if not improving by next week

## 2024-02-25 ENCOUNTER — Ambulatory Visit: Attending: Internal Medicine

## 2024-02-25 ENCOUNTER — Telehealth: Payer: Self-pay | Admitting: Internal Medicine

## 2024-02-25 ENCOUNTER — Other Ambulatory Visit: Payer: Self-pay | Admitting: Internal Medicine

## 2024-02-25 ENCOUNTER — Ambulatory Visit: Admitting: Internal Medicine

## 2024-02-25 VITALS — BP 107/73 | HR 77 | Ht 72.0 in | Wt 269.0 lb

## 2024-02-25 DIAGNOSIS — R002 Palpitations: Secondary | ICD-10-CM

## 2024-02-25 DIAGNOSIS — R42 Dizziness and giddiness: Secondary | ICD-10-CM | POA: Diagnosis not present

## 2024-02-25 DIAGNOSIS — R079 Chest pain, unspecified: Secondary | ICD-10-CM | POA: Insufficient documentation

## 2024-02-25 NOTE — Patient Instructions (Addendum)
 Medication Instructions:  Your physician recommends that you continue on your current medications as directed. Please refer to the Current Medication list given to you today.   Labwork: None  Testing/Procedures: Your physician has requested that you have an echocardiogram. Echocardiography is a painless test that uses sound waves to create images of your heart. It provides your doctor with information about the size and shape of your heart and how well your hearts chambers and valves are working. This procedure takes approximately one hour. There are no restrictions for this procedure. Please do NOT wear cologne, perfume, aftershave, or lotions (deodorant is allowed). Please arrive 15 minutes prior to your appointment time.  Please note: We ask at that you not bring children with you during ultrasound (echo/ vascular) testing. Due to room size and safety concerns, children are not allowed in the ultrasound rooms during exams. Our front office staff cannot provide observation of children in our lobby area while testing is being conducted. An adult accompanying a patient to their appointment will only be allowed in the ultrasound room at the discretion of the ultrasound technician under special circumstances. We apologize for any inconvenience.  Your physician has recommended that you wear a Zio monitor.   This monitor is a medical device that records the hearts electrical activity. Doctors most often use these monitors to diagnose arrhythmias. Arrhythmias are problems with the speed or rhythm of the heartbeat. The monitor is a small device applied to your chest. You can wear one while you do your normal daily activities. While wearing this monitor if you have any symptoms to push the button and record what you felt. Once you have worn this monitor for the period of time provider prescribed (for 14 days), you will return the monitor device in the postage paid box. Once it is returned they will download  the data collected and provide us  with a report which the provider will then review and we will call you with those results. Important tips:  Avoid showering during the first 24 hours of wearing the monitor. Avoid excessive sweating to help maximize wear time. Do not submerge the device, no hot tubs, and no swimming pools. Keep any lotions or oils away from the patch. After 24 hours you may shower with the patch on. Take brief showers with your back facing the shower head.  Do not remove patch once it has been placed because that will interrupt data and decrease adhesive wear time. Push the button when you have any symptoms and write down what you were feeling. Once you have completed wearing your monitor, remove and place into box which has postage paid and place in your outgoing mailbox.  If for some reason you have misplaced your box then call our office and we can provide another box and/or mail it off for you.    Follow-Up: Your physician recommends that you schedule a follow-up appointment in: 3 months  Any Other Special Instructions Will Be Listed Below (If Applicable). Thank you for choosing [facility]!     If you need a refill on your cardiac medications before your next appointment, please call your pharmacy.

## 2024-02-25 NOTE — Progress Notes (Signed)
 Cardiology Office Note  Date: 02/25/2024   ID: Tareq M Engelken, DOB February 01, 1976, MRN [id number]  PCP:  Orlando Perkins, PA-C  Cardiologist:  None Electrophysiologist:  None   History of Present Illness: Dan Castaneda is a 48 y.o. male known to have HTN was referred to cardiology clinic for evaluation of chest pain.  Patient had severe chest pain a few months ago and went to [facility] ER for evaluation.  Troponins and EKG within normal limits.  He did not have any recurrences since then however he reported having new onset palpitations since then.  He describes palpitations as irregular heartbeat, feels like his heart skipping a beat.  Associated with dizziness.  No syncope.  But had some presyncopal events.  No leg swelling.  SOB some but not much.   Past Medical History:  Diagnosis Date   Anxiety    Arthritis    BPH (benign prostatic hyperplasia)    GERD (gastroesophageal reflux disease)    History of kidney stones    Hypertension     Past Surgical History:  Procedure Laterality Date   CHOLECYSTECTOMY     KNEE ARTHROSCOPY WITH LATERAL MENISECTOMY Right 09/29/2019   Procedure: KNEE ARTHROSCOPY WITH LATERAL MENISECTOMY; CHONDROPLASTY OF PATELLA AND TIBIAL PLATEAU;  Surgeon: Margrette Taft BRAVO, MD;  Location: [facility];  Service: Orthopedics;  Laterality: Right;    Current Outpatient Medications  Medication Sig Dispense Refill   ALPRAZolam  (XANAX ) 1 MG tablet Take 1 mg by mouth at bedtime as needed.     Armodafinil 50 MG tablet Take 50 mg by mouth daily.     Ascorbic Acid (VITAMIN C) 1000 MG tablet Take 1,000 mg by mouth daily.     atenolol (TENORMIN) 50 MG tablet Take 50 mg by mouth daily.     celecoxib (CELEBREX) 200 MG capsule 2 (two) times daily.     DULoxetine (CYMBALTA) 60 MG capsule Take 60 mg by mouth at bedtime.  2   enalapril (VASOTEC) 2.5 MG tablet Take 2.5 mg by mouth daily.     gabapentin (NEURONTIN) 300 MG capsule Take by mouth 3 (three) times daily.      glycopyrrolate (ROBINUL) 1 MG tablet Take 1 mg by mouth 2 (two) times daily.     lansoprazole (PREVACID) 30 MG capsule Take by mouth.     loratadine (CLARITIN) 10 MG tablet Take 10 mg by mouth at bedtime.  11   lovastatin (MEVACOR) 10 MG tablet Take 10 mg by mouth at bedtime.     tamsulosin (FLOMAX) 0.4 MG CAPS capsule Take 0.4 mg by mouth daily.  4   testosterone cypionate (DEPOTESTOTERONE CYPIONATE) 100 MG/ML injection Inject 100 mg into the muscle every 7 (seven) days.     tiZANidine  (ZANAFLEX ) 4 MG tablet Take 1-2 tablets (4-8 mg total) by mouth every 6 (six) hours as needed for muscle spasms. 21 tablet 0   No current facility-administered medications for this visit.   Allergies:  Codeine and Semaglutide   Social History: The patient  reports that he quit smoking about 7 years ago. His smoking use included cigarettes. He started smoking about 29 years ago. He has a 22 pack-year smoking history. He has never used smokeless tobacco. He reports that he does not drink alcohol and does not use drugs.   Family History: The patient's family history includes Cancer in his father; Diabetes in his father and mother; Heart disease in his father and mother; Hypertension in his father and mother.   ROS:  Please see the history of present illness. Otherwise, complete review of systems is positive for none  All other systems are reviewed and negative.   Physical Exam: VS:  BP 107/73 (BP Location: Left Arm)   Pulse 77   Ht 6' (1.829 m)   Wt 269 lb (122 kg)   SpO2 98%   BMI 36.48 kg/m , BMI Body mass index is 36.48 kg/m.  Wt Readings from Last 3 Encounters:  02/25/24 269 lb (122 kg)  02/02/24 265 lb (120.2 kg)  10/23/23 259 lb (117.5 kg)    General: Patient appears comfortable at rest. HEENT: Conjunctiva and lids normal, oropharynx clear with moist mucosa. Neck: Supple, no elevated JVP or carotid bruits, no thyromegaly. Lungs: Clear to auscultation, nonlabored breathing at rest. Cardiac:  Regular rate and rhythm, no S3 or significant systolic murmur, no pericardial rub. Abdomen: Soft, nontender, no hepatomegaly, bowel sounds present, no guarding or rebound. Extremities: No pitting edema, distal pulses 2+. Skin: Warm and dry. Musculoskeletal: No kyphosis. Neuropsychiatric: Alert and oriented x3, affect grossly appropriate.  Recent Labwork: 10/23/2023: BUN 14; Creatinine, Ser 1.02; Hemoglobin 19.5; Platelets 249; Potassium 4.1; Sodium 134  No results found for: CHOL, TRIG, HDL, CHOLHDL, VLDL, LDLCALC, LDLDIRECT  Other Studies Reviewed Today:   Assessment and Plan:  Palpitations - Ongoing palpitations since August 2025.  Associated with dizziness.  Feels like his heart is skipping a beat.  EKG today is within normal limits.  Obtain 2-week event monitor, live.  Dizziness - Unclear etiology.  Occurs in association with palpitations and sometimes in isolation.  Normal blood pressure during these episodes.  Obtain echocardiogram.  Chest pain - Patient had severe chest pain in August 2025 prompting ER visit but did not have any recurrences.  Will continue to monitor.  40-minute spent in reviewing prior medical records, more than 3 labs, discussion and documentation    Medication Adjustments/Labs and Tests Ordered: Current medicines are reviewed at length with the patient today.  Concerns regarding medicines are outlined above.    Disposition:  Follow up 3 months  Signed Markeria Goetsch Priya Ethyn Schetter, MD, 02/25/2024 3:00 PM    [facility] [address] [place], [place], [place] [address]

## 2024-02-25 NOTE — Telephone Encounter (Signed)
 Checking percert on the following   2 week AT  - monitor

## 2024-03-14 ENCOUNTER — Ambulatory Visit

## 2024-03-30 ENCOUNTER — Ambulatory Visit: Payer: Self-pay | Admitting: Internal Medicine

## 2024-03-30 ENCOUNTER — Ambulatory Visit: Attending: Internal Medicine

## 2024-03-30 DIAGNOSIS — R42 Dizziness and giddiness: Secondary | ICD-10-CM

## 2024-03-30 LAB — ECHOCARDIOGRAM COMPLETE
AR max vel: 3.77 cm2
AV Area VTI: 4.28 cm2
AV Area mean vel: 3.88 cm2
AV Mean grad: 2.4 mmHg
AV Peak grad: 5.2 mmHg
Ao pk vel: 1.14 m/s
Area-P 1/2: 4.39 cm2
Calc EF: 63.2 %
S' Lateral: 2.8 cm
Single Plane A2C EF: 61 %
Single Plane A4C EF: 53.5 %

## 2024-05-31 ENCOUNTER — Ambulatory Visit: Admitting: Nurse Practitioner
